# Patient Record
Sex: Female | Born: 1970 | Race: White | Hispanic: No | Marital: Married | Smoking: Never smoker
Health system: Southern US, Community
[De-identification: ages and names within clinical notes are randomized; demographics above are authoritative.]

---

## 2018-02-04 ENCOUNTER — Ambulatory Visit: Payer: Self-pay | Admitting: Orthopedic Surgery

## 2018-02-04 NOTE — H&P (View-Only) (Signed)
Lisa Frederick is an 47 y.o. female.   Chief Complaint: back and L leg pain, weakness, numbness HPI: G-Lower Back Reported by patient. Location: left Duration: 4 months (but has been worse for about 3 weeks) Context: bending (for several hours) Aggravating Factors: sitting; lying Associated Symptoms: weakness; numbness (left lower extremity); radiation down leg (left lower extremity) Prior Imaging: MRI Notes: The patient is taking Gabapentin and is on day 12 of Prednisone. The patient is referred by Dr. Charlann Boxer. Patient is fairly active. Has moved here from the [place] area. To a lot of lifting bending. She had 4 months of back and hip or buttock pain that has progressed more recently down into the left leg with numbness and weakness. She was seen here by Dr. Charlann Boxer was kindly referred her for an evaluation. She has been taking gabapentin and is currently on a steroid tapering dose pack. And she is unable to get comfortable. She reports severe pain.  Patient pain drawing is organic. Rates her pain as 9/10 on the visual analog scale.  No pertinent past medical history  No past surgical history  No family history on file. Social History:  has no tobacco, alcohol, and drug history on file.  Tobacco Smoking Status: Never smoker Non-smoker Most Recent Tobacco Use Screening: 01/23/2018 Chewing tobacco: none Alcohol intake: Occasional Hand Dominance: Right Work related injury?: N Advance directive: N Radiation protection practitioner of Attorney: N Live alone or with others?: with others Marital status: Married  Allergies: No Known Allergies  Medications: gabapentin 300 mg capsule Percocet 5 mg-325 mg tablet predniSONE  Review of Systems  Constitutional: Negative.   HENT: Negative.   Eyes: Negative.   Respiratory: Negative.   Cardiovascular: Negative.   Gastrointestinal: Negative.   Genitourinary: Negative.   Musculoskeletal: Positive for back pain.  Skin: Negative.   Neurological: Positive for  sensory change and focal weakness.    There were no vitals taken for this visit. Physical Exam  Constitutional: She is oriented to person, place, and time. She appears well-developed and well-nourished. She appears distressed.  HENT:  Head: Normocephalic.  Eyes: Pupils are equal, round, and reactive to light.  Neck: Normal range of motion.  Cardiovascular: Normal rate and normal heart sounds.  Respiratory: Effort normal.  GI: Soft.  Musculoskeletal:  Patient is a 47 year old female.  Gait and Station: Appearance: ambulating with no assistive devices and antalgic gait.  Constitutional: General Appearance: healthy-appearing and distress (mild).  Psychiatric: Mood and Affect: active and alert.  Cardiovascular System: Edema Right: none; Dorsalis and posterior tibial pulses 2+. Edema Left: none.  Abdomen: Inspection and Palpation: non-distended and no tenderness.  Skin: Inspection and palpation: no rash.  Lumbar Spine: Inspection: normal alignment. Bony Palpation of the Lumbar Spine: tender at lumbosacral junction.. Bony Palpation of the Right Hip: no tenderness of the greater trochanter and tenderness of the SI joint; Pelvis stable. Bony Palpation of the Left Hip: no tenderness of the greater trochanter and tenderness of the SI joint. Soft Tissue Palpation on the Right: No flank pain with percussion. Active Range of Motion: limited flexion and extention.  Motor Strength: L1 Motor Strength on the Right: hip flexion iliopsoas 5/5. L1 Motor Strength on the Left: hip flexion iliopsoas 5/5. L2-L4 Motor Strength on the Right: knee extension quadriceps 5/5. L2-L4 Motor Strength on the Left: knee extension quadriceps 5/5. L5 Motor Strength on the Right: ankle dorsiflexion tibialis anterior 5/5 and great toe extension extensor hallucis longus 5/5. L5 Motor Strength on the Left: ankle dorsiflexion tibialis  anterior 5/5 and great toe extension extensor hallucis longus 4/5. S1 Motor Strength on the  Right: plantar flexion gastrocnemius 5/5. S1 Motor Strength on the Left: plantar flexion gastrocnemius 4/5.  Neurological System: Knee Reflex Right: normal (2). Knee Reflex Left: normal (2). Ankle Reflex Right: normal (2). Ankle Reflex Left: diminished (1). Babinski Reflex Right: plantar reflex absent. Babinski Reflex Left: plantar reflex absent. Sensation on the Right: normal distal extremities. Sensation on the Left: normal distal extremities and dereased sensation on the sole of the foot and the posterior leg (S1). Special Tests on the Right: no clonus of the ankle/knee and seated straight leg raising test positive. Special Tests on the Left: no clonus of the ankle/knee and seated straight leg raising test positive.  Patient moderately severe distress. She is tearful. Walks with antalgic gait. Unable to straighten the left leg.  Neurological: She is alert and oriented to person, place, and time.    X-rays of lumbar spine including AP, lateral, flexion-extension demonstrates a sacralized lumbar vertebral body. There is disc space narrowing at the lowest open segment labeled L5-S1. There is no instability in flexion-extension. There is no pars defect. Hip joints are unremarkable.  MRI dated 01/29/2018 demonstrates transitional anatomy. There is a large disc herniation extruded compressing the S1 nerve root against the facet and lamina on the left.  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  She has marketed neural tension signs with EHL weakness as well as altered sensation the S1 dermatome and diminished at the Achilles reflex.  Had a long discussion concerning the pathology relevant anatomy and treatment options. Discussed the concepts of disc pressure management as well as conservative versus operative treatment.  Given the neural compression size of her disc herniation I do not feel that  an epidural steroid injection would give any predictive relief. She has engage when a home activity modification strategies to avoid reinjury from her move here to [place]. Given the duration of symptoms and of neural compression in the presence of a neurologic deficit I would therefore feel that it is reasonable to proceed at this point in time with a micro decompression of L5-S1 on the left. She would be unable to tolerate any form of supervised physical therapy at this point in time due to the lack of excursion of the S1 nerve root due to its compression.  At this point exhausting conservative treatment and in the presence of a neurologic deficit we discussed microlumbar decompression. I discussed the risks and benefits including bleeding, infection, DVT, PE, anesthetic complications, worsening in their symptoms, improvement in their symptoms, C SF leakage, epidural fibrosis, need for future surgeries such as revision discectomy and lumbar fusion. I also indicated that this is an operation to basically decompress the nerve root to allow recovery as opposed to fixing a herniated disc and that the incidence of recurrent chest disc herniation can approach 15%. Also that nerve root recovery is variable and may not recover completely.  I discussed the operative course including overnight in the hospital. Immediate ambulation. Follow-up in 2 weeks for suture removal. 6 weeks until healing of the herniation followed by 6 weeks of reconditioning and strengthening of the core musculature. Also discussed the need to employ the concepts of disc pressure management and core motion following the surgery to minimize the risk of recurrent disc herniation. We will obtain preoperative clearance i if necessary and proceed accordingly.  I  have her complete her steroid dose pack. We discussed titrating her gabapentin for effect.  Also gave her prescription of her Percocet to be taken as directed.  Proceed with  surgical scheduling. If there is any changes in her symptoms in the meantime she is to call.  In the interim we discussed avoiding stretching of the sciatic nerve. In addition we discussed the possibility of fusion in the future. She did not have any significant chronic back pain prior to this to suggest that a concomitant fusion would be recommended.  I favor the conservative approach at this point in time. He also discussed the nerve root recovery. The possibility that she may have permanent symptomatology.  Appreciate the kind referral by Dr. Charlann Boxerlin in his expertise in expediting her treatment.  Plan microlumbar decompression L5-S1 left  Lisa Frederick, Lisa Priebe M., PA-C for Dr. Shelle IronBeane 02/04/2018, 4:34 PM

## 2018-02-04 NOTE — H&P (Signed)
Lisa Frederick is an 47 y.o. female.   Chief Complaint: back and L leg pain, weakness, numbness HPI: G-Lower Back Reported by patient. Location: left Duration: 4 months (but has been worse for about 3 weeks) Context: bending (for several hours) Aggravating Factors: sitting; lying Associated Symptoms: weakness; numbness (left lower extremity); radiation down leg (left lower extremity) Prior Imaging: MRI Notes: The patient is taking Gabapentin and is on day 12 of Prednisone. The patient is referred by Dr. Olin. Patient is fairly active. Has moved here from the [place] area. To a lot of lifting bending. She had 4 months of back and hip or buttock pain that has progressed more recently down into the left leg with numbness and weakness. She was seen here by Dr. Olin was kindly referred her for an evaluation. She has been taking gabapentin and is currently on a steroid tapering dose pack. And she is unable to get comfortable. She reports severe pain.  Patient pain drawing is organic. Rates her pain as 9/10 on the visual analog scale.  No pertinent past medical history  No past surgical history  No family history on file. Social History:  has no tobacco, alcohol, and drug history on file.  Tobacco Smoking Status: Never smoker Non-smoker Most Recent Tobacco Use Screening: 01/23/2018 Chewing tobacco: none Alcohol intake: Occasional Hand Dominance: Right Work related injury?: N Advance directive: N Medical Power of Attorney: N Live alone or with others?: with others Marital status: Married  Allergies: No Known Allergies  Medications: gabapentin 300 mg capsule Percocet 5 mg-325 mg tablet predniSONE  Review of Systems  Constitutional: Negative.   HENT: Negative.   Eyes: Negative.   Respiratory: Negative.   Cardiovascular: Negative.   Gastrointestinal: Negative.   Genitourinary: Negative.   Musculoskeletal: Positive for back pain.  Skin: Negative.   Neurological: Positive for  sensory change and focal weakness.    There were no vitals taken for this visit. Physical Exam  Constitutional: She is oriented to person, place, and time. She appears well-developed and well-nourished. She appears distressed.  HENT:  Head: Normocephalic.  Eyes: Pupils are equal, round, and reactive to light.  Neck: Normal range of motion.  Cardiovascular: Normal rate and normal heart sounds.  Respiratory: Effort normal.  GI: Soft.  Musculoskeletal:  Patient is a 47-year-old female.  Gait and Station: Appearance: ambulating with no assistive devices and antalgic gait.  Constitutional: General Appearance: healthy-appearing and distress (mild).  Psychiatric: Mood and Affect: active and alert.  Cardiovascular System: Edema Right: none; Dorsalis and posterior tibial pulses 2+. Edema Left: none.  Abdomen: Inspection and Palpation: non-distended and no tenderness.  Skin: Inspection and palpation: no rash.  Lumbar Spine: Inspection: normal alignment. Bony Palpation of the Lumbar Spine: tender at lumbosacral junction.. Bony Palpation of the Right Hip: no tenderness of the greater trochanter and tenderness of the SI joint; Pelvis stable. Bony Palpation of the Left Hip: no tenderness of the greater trochanter and tenderness of the SI joint. Soft Tissue Palpation on the Right: No flank pain with percussion. Active Range of Motion: limited flexion and extention.  Motor Strength: L1 Motor Strength on the Right: hip flexion iliopsoas 5/5. L1 Motor Strength on the Left: hip flexion iliopsoas 5/5. L2-L4 Motor Strength on the Right: knee extension quadriceps 5/5. L2-L4 Motor Strength on the Left: knee extension quadriceps 5/5. L5 Motor Strength on the Right: ankle dorsiflexion tibialis anterior 5/5 and great toe extension extensor hallucis longus 5/5. L5 Motor Strength on the Left: ankle dorsiflexion tibialis   anterior 5/5 and great toe extension extensor hallucis longus 4/5. S1 Motor Strength on the  Right: plantar flexion gastrocnemius 5/5. S1 Motor Strength on the Left: plantar flexion gastrocnemius 4/5.  Neurological System: Knee Reflex Right: normal (2). Knee Reflex Left: normal (2). Ankle Reflex Right: normal (2). Ankle Reflex Left: diminished (1). Babinski Reflex Right: plantar reflex absent. Babinski Reflex Left: plantar reflex absent. Sensation on the Right: normal distal extremities. Sensation on the Left: normal distal extremities and dereased sensation on the sole of the foot and the posterior leg (S1). Special Tests on the Right: no clonus of the ankle/knee and seated straight leg raising test positive. Special Tests on the Left: no clonus of the ankle/knee and seated straight leg raising test positive.  Patient moderately severe distress. She is tearful. Walks with antalgic gait. Unable to straighten the left leg.  Neurological: She is alert and oriented to person, place, and time.    X-rays of lumbar spine including AP, lateral, flexion-extension demonstrates a sacralized lumbar vertebral body. There is disc space narrowing at the lowest open segment labeled L5-S1. There is no instability in flexion-extension. There is no pars defect. Hip joints are unremarkable.  MRI dated 01/29/2018 demonstrates transitional anatomy. There is a large disc herniation extruded compressing the S1 nerve root against the facet and lamina on the left.  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  She has marketed neural tension signs with EHL weakness as well as altered sensation the S1 dermatome and diminished at the Achilles reflex.  Had a long discussion concerning the pathology relevant anatomy and treatment options. Discussed the concepts of disc pressure management as well as conservative versus operative treatment.  Given the neural compression size of her disc herniation I do not feel that  an epidural steroid injection would give any predictive relief. She has engage when a home activity modification strategies to avoid reinjury from her move here to [place]. Given the duration of symptoms and of neural compression in the presence of a neurologic deficit I would therefore feel that it is reasonable to proceed at this point in time with a micro decompression of L5-S1 on the left. She would be unable to tolerate any form of supervised physical therapy at this point in time due to the lack of excursion of the S1 nerve root due to its compression.  At this point exhausting conservative treatment and in the presence of a neurologic deficit we discussed microlumbar decompression. I discussed the risks and benefits including bleeding, infection, DVT, PE, anesthetic complications, worsening in their symptoms, improvement in their symptoms, C SF leakage, epidural fibrosis, need for future surgeries such as revision discectomy and lumbar fusion. I also indicated that this is an operation to basically decompress the nerve root to allow recovery as opposed to fixing a herniated disc and that the incidence of recurrent chest disc herniation can approach 15%. Also that nerve root recovery is variable and may not recover completely.  I discussed the operative course including overnight in the hospital. Immediate ambulation. Follow-up in 2 weeks for suture removal. 6 weeks until healing of the herniation followed by 6 weeks of reconditioning and strengthening of the core musculature. Also discussed the need to employ the concepts of disc pressure management and core motion following the surgery to minimize the risk of recurrent disc herniation. We will obtain preoperative clearance i if necessary and proceed accordingly.  I   have her complete her steroid dose pack. We discussed titrating her gabapentin for effect.  Also gave her prescription of her Percocet to be taken as directed.  Proceed with  surgical scheduling. If there is any changes in her symptoms in the meantime she is to call.  In the interim we discussed avoiding stretching of the sciatic nerve. In addition we discussed the possibility of fusion in the future. She did not have any significant chronic back pain prior to this to suggest that a concomitant fusion would be recommended.  I favor the conservative approach at this point in time. He also discussed the nerve root recovery. The possibility that she may have permanent symptomatology.  Appreciate the kind referral by Dr. Olin in his expertise in expediting her treatment.  Plan microlumbar decompression L5-S1 left  Lisa Frederick M., PA-C for Dr. Beane 02/04/2018, 4:34 PM   

## 2018-02-05 NOTE — Patient Instructions (Signed)
Lisa Frederick  02/05/2018   Your procedure is scheduled on: 02-11-18   Report [facility] Main  Entr[facility]    Report to Admitting at 5:30  AM    Call this number if you have problems the morning of surgery [phone]   Remember: Do not eat food or drink liquids :After Midnight.    BRUSH YOUR TEETH MORNING OF SURGERY AND RINSE YOUR MOUTH OUT, NO CHEWING GUM CANDY OR MINTS.     Take these medicines the morning of surgery with A SIP OF WATER: Gabapentin and pain medication as needed                                You may not have any metal on your body including hair pins and              piercings  Do not wear jewelry, make-up, lotions, powders or perfumes, deodorant             Do not wear nail polish.  Do not shave  48 hours prior to surgery.               Do not bring valuables to the hospital. [place] [place] [facility] [place] IS NOT             RESPONSIBLE   FOR VALUABLES.  Contacts, dentures or bridgework may not be worn into surgery.  Leave suitcase in the car. After surgery it may be brought to your room.     Special Instructions: N/A              Please read over the following fact sheets you were given: _____________________________________________________________________             [facility] - Preparing for Surgery Before surgery, you can play an important role.  Because skin is not sterile, your skin needs to be as free of germs as possible.  You can reduce the number of germs on your skin by washing with CHG (chlorahexidine gluconate) soap before surgery.  CHG is an antiseptic cleaner which kills germs and bonds with the skin to continue killing germs even after washing. Please DO NOT use if you have an allergy to CHG or antibacterial soaps.  If your skin becomes reddened/irritated stop using the CHG and inform your nurse when you arrive at Short Stay. Do not shave (including legs and underarms) for at least 48 hours prior to the first CHG shower.  You may  shave your face/neck. Please follow these instructions carefully:  1.  Shower with CHG Soap the night before surgery and the  morning of Surgery.  2.  If you choose to wash your hair, wash your hair first as usual with your  normal  shampoo.  3.  After you shampoo, rinse your hair and body thoroughly to remove the  shampoo.                           4.  Use CHG as you would any other liquid soap.  You can apply chg directly  to the skin and wash                       Gently with a scrungie or clean washcloth.  5.  Apply the CHG Soap to  your body ONLY FROM THE NECK DOWN.   Do not use on face/ open                           Wound or open sores. Avoid contact with eyes, ears mouth and genitals (private parts).                       Wash face,  Genitals (private parts) with your normal soap.             6.  Wash thoroughly, paying special attention to the area where your surgery  will be performed.  7.  Thoroughly rinse your body with warm water from the neck down.  8.  DO NOT shower/wash with your normal soap after using and rinsing off  the CHG Soap.                9.  Pat yourself dry with a clean towel.            10.  Wear clean pajamas.            11.  Place clean sheets on your bed the night of your first shower and do not  sleep with pets. Day of Surgery : Do not apply any lotions/deodorants the morning of surgery.  Please wear clean clothes to the hospital/surgery center.  FAILURE TO FOLLOW THESE INSTRUCTIONS MAY RESULT IN THE CANCELLATION OF YOUR SURGERY PATIENT SIGNATURE_________________________________  NURSE SIGNATURE__________________________________  ________________________________________________________________________   Lisa Frederick  An incentive spirometer is a tool that can help keep your lungs clear and active. This tool measures how well you are filling your lungs with each breath. Taking long deep breaths may help reverse or decrease the chance of developing  breathing (pulmonary) problems (especially infection) following:  A long period of time when you are unable to move or be active. BEFORE THE PROCEDURE   If the spirometer includes an indicator to show your best effort, your nurse or respiratory therapist will set it to a desired goal.  If possible, sit up straight or lean slightly forward. Try not to slouch.  Hold the incentive spirometer in an upright position. INSTRUCTIONS FOR USE  1. Sit on the edge of your bed if possible, or sit up as far as you can in bed or on a chair. 2. Hold the incentive spirometer in an upright position. 3. Breathe out normally. 4. Place the mouthpiece in your mouth and seal your lips tightly around it. 5. Breathe in slowly and as deeply as possible, raising the piston or the ball toward the top of the column. 6. Hold your breath for 3-5 seconds or for as long as possible. Allow the piston or ball to fall to the bottom of the column. 7. Remove the mouthpiece from your mouth and breathe out normally. 8. Rest for a few seconds and repeat Steps 1 through 7 at least 10 times every 1-2 hours when you are awake. Take your time and take a few normal breaths between deep breaths. 9. The spirometer may include an indicator to show your best effort. Use the indicator as a goal to work toward during each repetition. 10. After each set of 10 deep breaths, practice coughing to be sure your lungs are clear. If you have an incision (the cut made at the time of surgery), support your incision when coughing by placing a pillow or rolled up towels firmly against  it. Once you are able to get out of bed, walk around indoors and cough well. You may stop using the incentive spirometer when instructed by your caregiver.  RISKS AND COMPLICATIONS  Take your time so you do not get dizzy or light-headed.  If you are in pain, you may need to take or ask for pain medication before doing incentive spirometry. It is harder to take a deep  breath if you are having pain. AFTER USE  Rest and breathe slowly and easily.  It can be helpful to keep track of a log of your progress. Your caregiver can provide you with a simple table to help with this. If you are using the spirometer at home, follow these instructions: [place] IF:   You are having difficultly using the spirometer.  You have trouble using the spirometer as often as instructed.  Your pain medication is not giving enough relief while using the spirometer.  You develop fever of 100.5 F (38.1 C) or higher. SEEK IMMEDIATE MEDICAL CARE IF:   You cough up bloody sputum that had not been present before.  You develop fever of 102 F (38.9 C) or greater.  You develop worsening pain at or near the incision site. MAKE SURE YOU:   Understand these instructions.  Will watch your condition.  Will get help right away if you are not doing well or get worse. Document Released: 09/16/2006 Document Revised: 07/29/2011 Document Reviewed: 11/17/2006 [facility] Patient Information 2014 [place], [place].   ________________________________________________________________________

## 2018-02-10 ENCOUNTER — Encounter (HOSPITAL_COMMUNITY)
Admission: RE | Admit: 2018-02-10 | Discharge: 2018-02-10 | Disposition: A | Discharge status: Home / Self Care | Payer: 59 | Source: Ambulatory Visit | Attending: Specialist | Admitting: Specialist

## 2018-02-10 ENCOUNTER — Encounter (HOSPITAL_COMMUNITY): Payer: Self-pay | Admitting: *Deleted

## 2018-02-10 ENCOUNTER — Ambulatory Visit (HOSPITAL_COMMUNITY)
Admission: RE | Admit: 2018-02-10 | Discharge: 2018-02-10 | Disposition: A | Discharge status: Home / Self Care | Payer: 59 | Source: Ambulatory Visit | Attending: Orthopedic Surgery | Admitting: Orthopedic Surgery

## 2018-02-10 ENCOUNTER — Other Ambulatory Visit: Payer: Self-pay

## 2018-02-10 DIAGNOSIS — M5126 Other intervertebral disc displacement, lumbar region: Secondary | ICD-10-CM | POA: Diagnosis present

## 2018-02-10 LAB — BASIC METABOLIC PANEL
ANION GAP: 7 (ref 5–15)
BUN: 21 mg/dL — ABNORMAL HIGH (ref 6–20)
CO2: 29 mmol/L (ref 22–32)
Calcium: 9.5 mg/dL (ref 8.9–10.3)
Chloride: 106 mmol/L (ref 98–111)
Creatinine, Ser: 0.76 mg/dL (ref 0.44–1.00)
GFR calc Af Amer: >60 mL/min (ref >60)
GLUCOSE: 83 mg/dL (ref 70–99)
POTASSIUM: 4.1 mmol/L (ref 3.5–5.1)
Sodium: 142 mmol/L (ref 135–145)

## 2018-02-10 LAB — CBC
HEMATOCRIT: 37.3 % (ref 36.0–46.0)
HEMOGLOBIN: 12.7 g/dL (ref 12.0–15.0)
MCH: 35.1 pg — AB (ref 26.0–34.0)
MCHC: 34 g/dL (ref 30.0–36.0)
MCV: 103 fL — ABNORMAL HIGH (ref 78.0–100.0)
Platelets: 245 10*3/uL (ref 150–400)
RBC: 3.62 MIL/uL — ABNORMAL LOW (ref 3.87–5.11)
RDW: 12.4 % (ref 11.5–15.5)
WBC: 6.3 10*3/uL (ref 4.0–10.5)

## 2018-02-10 LAB — SURGICAL PCR SCREEN
MRSA, PCR: NEGATIVE
Staphylococcus aureus: NEGATIVE

## 2018-02-10 LAB — PREGNANCY, URINE: Preg Test, Ur: NEGATIVE

## 2018-02-10 NOTE — Anesthesia Preprocedure Evaluation (Addendum)
Anesthesia Evaluation  Patient identified by MRN, date of birth, ID band Patient awake    Reviewed: Allergy & Precautions, NPO status , Patient's Chart, lab work & pertinent test results  Airway Mallampati: II  TM Distance: >3 FB Neck ROM: Full    Dental  (+) Dental Advisory Given   Pulmonary neg pulmonary ROS,    breath sounds clear to auscultation       Cardiovascular negative cardio ROS   Rhythm:Regular Rate:Normal     Neuro/Psych negative neurological ROS     GI/Hepatic negative GI ROS, Neg liver ROS,   Endo/Other  negative endocrine ROS  Renal/GU negative Renal ROS     Musculoskeletal   Abdominal   Peds  Hematology negative hematology ROS (+)   Anesthesia Other Findings   Reproductive/Obstetrics                            Lab Results  Component Value Date   WBC 6.3 02/10/2018   HGB 12.7 02/10/2018   HCT 37.3 02/10/2018   MCV 103.0 (H) 02/10/2018   PLT 245 02/10/2018   Lab Results  Component Value Date   CREATININE 0.76 02/10/2018   BUN 21 (H) 02/10/2018   NA 142 02/10/2018   K 4.1 02/10/2018   CL 106 02/10/2018   CO2 29 02/10/2018    Anesthesia Physical Anesthesia Plan  ASA: I  Anesthesia Plan: General   Post-op Pain Management:    Induction: Intravenous  PONV Risk Score and Plan: 3 and Midazolam, Dexamethasone, Ondansetron and Treatment may vary due to age or medical condition  Airway Management Planned: Oral ETT  Additional Equipment:   Intra-op Plan:   Post-operative Plan: Extubation in OR  Informed Consent: I have reviewed the patients History and Physical, chart, labs and discussed the procedure including the risks, benefits and alternatives for the proposed anesthesia with the patient or authorized representative who has indicated his/her understanding and acceptance.   Dental advisory given  Plan Discussed with: CRNA  Anesthesia Plan Comments:         Anesthesia Quick Evaluation

## 2018-02-11 ENCOUNTER — Ambulatory Visit (HOSPITAL_COMMUNITY): Payer: 59

## 2018-02-11 ENCOUNTER — Encounter (HOSPITAL_COMMUNITY)
Admission: RE | Disposition: A | Discharge status: Home / Self Care | Payer: Self-pay | Source: Ambulatory Visit | Attending: Specialist

## 2018-02-11 ENCOUNTER — Encounter (HOSPITAL_COMMUNITY): Payer: Self-pay

## 2018-02-11 ENCOUNTER — Ambulatory Visit (HOSPITAL_COMMUNITY)
Admission: RE | Admit: 2018-02-11 | Discharge: 2018-02-12 | Disposition: A | Discharge status: Home / Self Care | Payer: 59 | Source: Ambulatory Visit | Attending: Specialist | Admitting: Specialist

## 2018-02-11 ENCOUNTER — Ambulatory Visit (HOSPITAL_COMMUNITY): Payer: 59 | Admitting: Certified Registered"

## 2018-02-11 DIAGNOSIS — R531 Weakness: Secondary | ICD-10-CM | POA: Insufficient documentation

## 2018-02-11 DIAGNOSIS — M5117 Intervertebral disc disorders with radiculopathy, lumbosacral region: Secondary | ICD-10-CM | POA: Diagnosis not present

## 2018-02-11 DIAGNOSIS — M5126 Other intervertebral disc displacement, lumbar region: Secondary | ICD-10-CM

## 2018-02-11 DIAGNOSIS — R200 Anesthesia of skin: Secondary | ICD-10-CM | POA: Diagnosis not present

## 2018-02-11 DIAGNOSIS — Z79899 Other long term (current) drug therapy: Secondary | ICD-10-CM | POA: Insufficient documentation

## 2018-02-11 DIAGNOSIS — M4807 Spinal stenosis, lumbosacral region: Secondary | ICD-10-CM | POA: Diagnosis present

## 2018-02-11 DIAGNOSIS — Z419 Encounter for procedure for purposes other than remedying health state, unspecified: Secondary | ICD-10-CM

## 2018-02-11 HISTORY — PX: LUMBAR LAMINECTOMY/DECOMPRESSION MICRODISCECTOMY: SHX5026

## 2018-02-11 SURGERY — LUMBAR LAMINECTOMY/DECOMPRESSION MICRODISCECTOMY 1 LEVEL
Anesthesia: General | Laterality: Left

## 2018-02-11 MED ORDER — SUGAMMADEX SODIUM 200 MG/2ML IV SOLN
INTRAVENOUS | Status: DC | PRN
  Administered 2018-02-11: 100 mg via INTRAVENOUS

## 2018-02-11 MED ORDER — BUPIVACAINE-EPINEPHRINE 0.5% -1:200000 IJ SOLN
INTRAMUSCULAR | Status: DC | PRN
  Administered 2018-02-11: 10 mL

## 2018-02-11 MED ORDER — SODIUM CHLORIDE 0.9 % IV SOLN
INTRAVENOUS | Status: DC | PRN
  Administered 2018-02-11: 500 mL

## 2018-02-11 MED ORDER — SODIUM CHLORIDE 0.9 % IV SOLN
INTRAVENOUS | Status: AC
  Filled 2018-02-11: qty 500000

## 2018-02-11 MED ORDER — THROMBIN (RECOMBINANT) 5000 UNITS EX SOLR
CUTANEOUS | Status: AC
  Filled 2018-02-11: qty 10000

## 2018-02-11 MED ORDER — MIDAZOLAM HCL 5 MG/5ML IJ SOLN
INTRAMUSCULAR | Status: DC | PRN
  Administered 2018-02-11: 2 mg via INTRAVENOUS

## 2018-02-11 MED ORDER — OXYCODONE HCL 5 MG PO TABS
10.0000 mg | ORAL_TABLET | ORAL | Status: DC | PRN
  Administered 2018-02-11 – 2018-02-12 (×3): 10 mg via ORAL
  Filled 2018-02-11 (×3): qty 2

## 2018-02-11 MED ORDER — ROCURONIUM BROMIDE 100 MG/10ML IV SOLN
INTRAVENOUS | Status: DC | PRN
  Administered 2018-02-11 (×2): 10 mg via INTRAVENOUS
  Administered 2018-02-11 (×2): 5 mg via INTRAVENOUS
  Administered 2018-02-11: 30 mg via INTRAVENOUS
  Administered 2018-02-11: 20 mg via INTRAVENOUS

## 2018-02-11 MED ORDER — POLYETHYLENE GLYCOL 3350 17 G PO PACK: 17.0000 g | PACK | Freq: Every day | ORAL | Status: DC | PRN

## 2018-02-11 MED ORDER — SCOPOLAMINE 1 MG/3DAYS TD PT72
MEDICATED_PATCH | TRANSDERMAL | Status: AC
  Filled 2018-02-11: qty 1

## 2018-02-11 MED ORDER — DEXAMETHASONE SODIUM PHOSPHATE 10 MG/ML IJ SOLN
INTRAMUSCULAR | Status: DC | PRN
  Administered 2018-02-11: 10 mg via INTRAVENOUS

## 2018-02-11 MED ORDER — MAGNESIUM CITRATE PO SOLN: 1.0000 | Freq: Once | ORAL | Status: DC | PRN

## 2018-02-11 MED ORDER — DOCUSATE SODIUM 100 MG PO CAPS: 100.0000 mg | ORAL_CAPSULE | Freq: Two times a day (BID) | ORAL | 2 refills | Status: DC

## 2018-02-11 MED ORDER — ACETAMINOPHEN 325 MG PO TABS: 650.0000 mg | ORAL_TABLET | ORAL | Status: DC | PRN

## 2018-02-11 MED ORDER — CEFAZOLIN SODIUM-DEXTROSE 2-4 GM/100ML-% IV SOLN
2.0000 g | INTRAVENOUS | Status: AC
  Administered 2018-02-11: 2 g via INTRAVENOUS
  Filled 2018-02-11: qty 100

## 2018-02-11 MED ORDER — METHOCARBAMOL 500 MG IVPB - SIMPLE MED
INTRAVENOUS | Status: AC
  Filled 2018-02-11: qty 50

## 2018-02-11 MED ORDER — MENTHOL 3 MG MT LOZG: 1.0000 | LOZENGE | OROMUCOSAL | Status: DC | PRN

## 2018-02-11 MED ORDER — ONDANSETRON HCL 4 MG/2ML IJ SOLN
INTRAMUSCULAR | Status: AC
  Filled 2018-02-11: qty 2

## 2018-02-11 MED ORDER — LIDOCAINE 2% (20 MG/ML) 5 ML SYRINGE
INTRAMUSCULAR | Status: DC | PRN
  Administered 2018-02-11: 60 mg via INTRAVENOUS

## 2018-02-11 MED ORDER — SCOPOLAMINE 1 MG/3DAYS TD PT72
MEDICATED_PATCH | TRANSDERMAL | Status: DC | PRN
  Administered 2018-02-11: 1 via TRANSDERMAL

## 2018-02-11 MED ORDER — PROPOFOL 10 MG/ML IV BOLUS
INTRAVENOUS | Status: AC
  Filled 2018-02-11: qty 20

## 2018-02-11 MED ORDER — ONDANSETRON HCL 4 MG/2ML IJ SOLN
INTRAMUSCULAR | Status: DC | PRN
  Administered 2018-02-11 (×2): 4 mg via INTRAVENOUS

## 2018-02-11 MED ORDER — ACETAMINOPHEN 10 MG/ML IV SOLN
1000.0000 mg | INTRAVENOUS | Status: AC
  Administered 2018-02-11: 1000 mg via INTRAVENOUS
  Filled 2018-02-11: qty 100

## 2018-02-11 MED ORDER — OXYCODONE HCL 5 MG PO TABS
5.0000 mg | ORAL_TABLET | ORAL | Status: DC | PRN
  Administered 2018-02-11: 5 mg via ORAL
  Filled 2018-02-11: qty 1

## 2018-02-11 MED ORDER — POLYETHYLENE GLYCOL 3350 17 G PO PACK: 17.0000 g | PACK | Freq: Every day | ORAL | 0 refills | Status: DC

## 2018-02-11 MED ORDER — LIDOCAINE 2% (20 MG/ML) 5 ML SYRINGE
INTRAMUSCULAR | Status: AC
  Filled 2018-02-11: qty 5

## 2018-02-11 MED ORDER — ACETAMINOPHEN 650 MG RE SUPP: 650.0000 mg | RECTAL | Status: DC | PRN

## 2018-02-11 MED ORDER — SODIUM CHLORIDE 0.9 % IJ SOLN
INTRAMUSCULAR | Status: AC
  Filled 2018-02-11: qty 10

## 2018-02-11 MED ORDER — THROMBIN 5000 UNITS EX SOLR
OROMUCOSAL | Status: DC | PRN
  Administered 2018-02-11: 10:00:00 via TOPICAL

## 2018-02-11 MED ORDER — HYDROMORPHONE HCL 1 MG/ML IJ SOLN: 0.5000 mg | INTRAMUSCULAR | Status: DC | PRN

## 2018-02-11 MED ORDER — FENTANYL CITRATE (PF) 100 MCG/2ML IJ SOLN
25.0000 ug | INTRAMUSCULAR | Status: DC | PRN
  Administered 2018-02-11 (×2): 25 ug via INTRAVENOUS

## 2018-02-11 MED ORDER — ONDANSETRON HCL 4 MG PO TABS: 4.0000 mg | ORAL_TABLET | Freq: Four times a day (QID) | ORAL | Status: DC | PRN

## 2018-02-11 MED ORDER — SUGAMMADEX SODIUM 200 MG/2ML IV SOLN
INTRAVENOUS | Status: AC
  Filled 2018-02-11: qty 2

## 2018-02-11 MED ORDER — BUPIVACAINE-EPINEPHRINE (PF) 0.5% -1:200000 IJ SOLN
INTRAMUSCULAR | Status: AC
  Filled 2018-02-11: qty 30

## 2018-02-11 MED ORDER — RISAQUAD PO CAPS
1.0000 | ORAL_CAPSULE | Freq: Every day | ORAL | Status: DC
  Administered 2018-02-11 – 2018-02-12 (×2): 1 via ORAL
  Filled 2018-02-11 (×2): qty 1

## 2018-02-11 MED ORDER — DOCUSATE SODIUM 100 MG PO CAPS
100.0000 mg | ORAL_CAPSULE | Freq: Two times a day (BID) | ORAL | Status: DC
  Administered 2018-02-11 – 2018-02-12 (×2): 100 mg via ORAL
  Filled 2018-02-11 (×2): qty 1

## 2018-02-11 MED ORDER — KCL IN DEXTROSE-NACL 20-5-0.45 MEQ/L-%-% IV SOLN
INTRAVENOUS | Status: DC
  Administered 2018-02-11: 16:00:00 via INTRAVENOUS
  Filled 2018-02-11: qty 1000

## 2018-02-11 MED ORDER — ALUM & MAG HYDROXIDE-SIMETH 200-200-20 MG/5ML PO SUSP: 30.0000 mL | Freq: Four times a day (QID) | ORAL | Status: DC | PRN

## 2018-02-11 MED ORDER — FENTANYL CITRATE (PF) 100 MCG/2ML IJ SOLN
INTRAMUSCULAR | Status: AC
  Filled 2018-02-11: qty 2

## 2018-02-11 MED ORDER — LACTATED RINGERS IV SOLN
INTRAVENOUS | Status: DC
  Administered 2018-02-11: 09:00:00 via INTRAVENOUS
  Administered 2018-02-11: 1000 mL via INTRAVENOUS

## 2018-02-11 MED ORDER — PROMETHAZINE HCL 25 MG/ML IJ SOLN: 6.2500 mg | INTRAMUSCULAR | Status: DC | PRN

## 2018-02-11 MED ORDER — CEFAZOLIN SODIUM-DEXTROSE 2-4 GM/100ML-% IV SOLN
2.0000 g | Freq: Three times a day (TID) | INTRAVENOUS | Status: AC
  Administered 2018-02-11 (×2): 2 g via INTRAVENOUS
  Filled 2018-02-11 (×2): qty 100

## 2018-02-11 MED ORDER — METHOCARBAMOL 500 MG PO TABS
500.0000 mg | ORAL_TABLET | Freq: Four times a day (QID) | ORAL | Status: DC | PRN
  Administered 2018-02-11 – 2018-02-12 (×2): 500 mg via ORAL
  Filled 2018-02-11 (×2): qty 1

## 2018-02-11 MED ORDER — FENTANYL CITRATE (PF) 100 MCG/2ML IJ SOLN
INTRAMUSCULAR | Status: DC | PRN
  Administered 2018-02-11: 50 ug via INTRAVENOUS
  Administered 2018-02-11: 25 ug via INTRAVENOUS
  Administered 2018-02-11 (×2): 50 ug via INTRAVENOUS
  Administered 2018-02-11: 25 ug via INTRAVENOUS

## 2018-02-11 MED ORDER — DEXAMETHASONE SODIUM PHOSPHATE 10 MG/ML IJ SOLN
INTRAMUSCULAR | Status: AC
  Filled 2018-02-11: qty 1

## 2018-02-11 MED ORDER — GABAPENTIN 300 MG PO CAPS: 300.0000 mg | ORAL_CAPSULE | Freq: Two times a day (BID) | ORAL | Status: DC | PRN

## 2018-02-11 MED ORDER — PREDNISONE 5 MG PO TABS: 5.0000 mg | ORAL_TABLET | ORAL | Status: DC

## 2018-02-11 MED ORDER — PROPOFOL 10 MG/ML IV BOLUS
INTRAVENOUS | Status: DC | PRN
  Administered 2018-02-11: 150 mg via INTRAVENOUS
  Administered 2018-02-11: 30 mg via INTRAVENOUS

## 2018-02-11 MED ORDER — ROCURONIUM BROMIDE 10 MG/ML (PF) SYRINGE
PREFILLED_SYRINGE | INTRAVENOUS | Status: AC
  Filled 2018-02-11: qty 10

## 2018-02-11 MED ORDER — MIDAZOLAM HCL 2 MG/2ML IJ SOLN
INTRAMUSCULAR | Status: AC
  Filled 2018-02-11: qty 2

## 2018-02-11 MED ORDER — METHOCARBAMOL 500 MG PO TABS: 500.0000 mg | ORAL_TABLET | Freq: Four times a day (QID) | ORAL | 1 refills | Status: DC | PRN

## 2018-02-11 MED ORDER — OXYCODONE HCL 5 MG PO TABS: 5.0000 mg | ORAL_TABLET | ORAL | 0 refills | Status: DC | PRN

## 2018-02-11 MED ORDER — FENTANYL CITRATE (PF) 250 MCG/5ML IJ SOLN
INTRAMUSCULAR | Status: AC
  Filled 2018-02-11: qty 5

## 2018-02-11 MED ORDER — BISACODYL 5 MG PO TBEC: 5.0000 mg | DELAYED_RELEASE_TABLET | Freq: Every day | ORAL | Status: DC | PRN

## 2018-02-11 MED ORDER — METHOCARBAMOL 500 MG IVPB - SIMPLE MED
500.0000 mg | Freq: Four times a day (QID) | INTRAVENOUS | Status: DC | PRN
  Administered 2018-02-11: 500 mg via INTRAVENOUS
  Filled 2018-02-11: qty 50

## 2018-02-11 MED ORDER — PROPOFOL 500 MG/50ML IV EMUL
INTRAVENOUS | Status: DC | PRN
  Administered 2018-02-11: 25 ug/kg/min via INTRAVENOUS

## 2018-02-11 MED ORDER — ONDANSETRON HCL 4 MG/2ML IJ SOLN: 4.0000 mg | Freq: Four times a day (QID) | INTRAMUSCULAR | Status: DC | PRN

## 2018-02-11 MED ORDER — PHENOL 1.4 % MT LIQD: 1.0000 | OROMUCOSAL | Status: DC | PRN

## 2018-02-11 SURGICAL SUPPLY — 51 items
BAG ZIPLOCK 12X15 (MISCELLANEOUS) IMPLANT
CLEANER TIP ELECTROSURG 2X2 (MISCELLANEOUS) ×2 IMPLANT
CLOTH 2% CHLOROHEXIDINE 3PK (PERSONAL CARE ITEMS) ×2 IMPLANT
COVER SURGICAL LIGHT HANDLE (MISCELLANEOUS) ×2 IMPLANT
DRAPE MICROSCOPE LEICA (MISCELLANEOUS) ×2 IMPLANT
DRAPE POUCH INSTRU U-SHP 10X18 (DRAPES) ×2 IMPLANT
DRAPE SHEET LG 3/4 BI-LAMINATE (DRAPES) ×2 IMPLANT
DRAPE SURG 17X11 SM STRL (DRAPES) ×2 IMPLANT
DRAPE UTILITY XL STRL (DRAPES) ×2 IMPLANT
DRSG AQUACEL AG ADV 3.5X 4 (GAUZE/BANDAGES/DRESSINGS) ×2 IMPLANT
DRSG AQUACEL AG ADV 3.5X 6 (GAUZE/BANDAGES/DRESSINGS) IMPLANT
DRSG TELFA 3X8 NADH (GAUZE/BANDAGES/DRESSINGS) IMPLANT
DURAPREP 26ML APPLICATOR (WOUND CARE) ×2 IMPLANT
DURASEAL SPINE SEALANT 3ML (MISCELLANEOUS) IMPLANT
ELECT BLADE TIP CTD 4 INCH (ELECTRODE) ×2 IMPLANT
ELECT REM PT RETURN 15FT ADLT (MISCELLANEOUS) ×2 IMPLANT
GLOVE BIOGEL PI IND STRL 7.5 (GLOVE) ×1 IMPLANT
GLOVE BIOGEL PI IND STRL 9 (GLOVE) ×1 IMPLANT
GLOVE BIOGEL PI INDICATOR 7.5 (GLOVE) ×1
GLOVE BIOGEL PI INDICATOR 9 (GLOVE) ×1
GLOVE ECLIPSE 8.5 STRL (GLOVE) ×2 IMPLANT
GLOVE SURG SS PI 7.5 STRL IVOR (GLOVE) ×4 IMPLANT
GLOVE SURG SS PI 8.0 STRL IVOR (GLOVE) ×6 IMPLANT
GOWN SPEC L4 XLG W/TWL (GOWN DISPOSABLE) ×2 IMPLANT
GOWN STRL REUS W/TWL XL LVL3 (GOWN DISPOSABLE) ×4 IMPLANT
HEMOSTAT SPONGE AVITENE ULTRA (HEMOSTASIS) IMPLANT
IV CATH 14GX2 1/4 (CATHETERS) ×2 IMPLANT
KIT BASIN OR (CUSTOM PROCEDURE TRAY) ×2 IMPLANT
KIT POSITIONING SURG ANDREWS (MISCELLANEOUS) ×2 IMPLANT
MANIFOLD NEPTUNE II (INSTRUMENTS) ×2 IMPLANT
NEEDLE SPNL 18GX3.5 QUINCKE PK (NEEDLE) ×4 IMPLANT
PACK LAMINECTOMY ORTHO (CUSTOM PROCEDURE TRAY) ×2 IMPLANT
PATTIES SURGICAL .5 X.5 (GAUZE/BANDAGES/DRESSINGS) ×4 IMPLANT
PATTIES SURGICAL .75X.75 (GAUZE/BANDAGES/DRESSINGS) ×2 IMPLANT
PATTIES SURGICAL 1X1 (DISPOSABLE) IMPLANT
RUBBERBAND STERILE (MISCELLANEOUS) ×2 IMPLANT
SPONGE SURGIFOAM ABS GEL 100 (HEMOSTASIS) ×2 IMPLANT
STAPLER VISISTAT (STAPLE) IMPLANT
STRIP CLOSURE SKIN 1/2X4 (GAUZE/BANDAGES/DRESSINGS) ×2 IMPLANT
SUT NURALON 4 0 TR CR/8 (SUTURE) IMPLANT
SUT PROLENE 3 0 PS 2 (SUTURE) ×2 IMPLANT
SUT VIC AB 1 CT1 27 (SUTURE)
SUT VIC AB 1 CT1 27XBRD ANTBC (SUTURE) IMPLANT
SUT VIC AB 1-0 CT2 27 (SUTURE) ×2 IMPLANT
SUT VIC AB 2-0 CT1 27 (SUTURE)
SUT VIC AB 2-0 CT1 TAPERPNT 27 (SUTURE) IMPLANT
SUT VIC AB 2-0 CT2 27 (SUTURE) ×2 IMPLANT
SYR 3ML LL SCALE MARK (SYRINGE) IMPLANT
TOWEL OR 17X26 10 PK STRL BLUE (TOWEL DISPOSABLE) IMPLANT
TOWEL OR NON WOVEN STRL DISP B (DISPOSABLE) IMPLANT
YANKAUER SUCT BULB TIP NO VENT (SUCTIONS) ×2 IMPLANT

## 2018-02-11 NOTE — Anesthesia Procedure Notes (Signed)
Procedure Name: Intubation Date/Time: 02/11/2018 7:29 AM Performed by: Marny Lowenstein, CRNA Pre-anesthesia Checklist: Patient identified, Emergency Drugs available, Suction available and Patient being monitored Patient Re-evaluated:Patient Re-evaluated prior to induction Oxygen Delivery Method: Circle system utilized Preoxygenation: Pre-oxygenation with 100% oxygen Induction Type: IV induction Ventilation: Mask ventilation without difficulty Laryngoscope Size: Miller and 2 Grade View: Grade I Tube type: Oral Tube size: 6.5 mm Number of attempts: 1 Placement Confirmation: ETT inserted through vocal cords under direct vision,  positive ETCO2,  CO2 detector and breath sounds checked- equal and bilateral Secured at: 19 cm Tube secured with: Tape Dental Injury: Teeth and Oropharynx as per pre-operative assessment

## 2018-02-11 NOTE — Interval H&P Note (Signed)
History and Physical Interval Note:  02/11/2018 7:26 AM  Lisa Frederick  has presented today for surgery, with the diagnosis of HNP L5-S1  The various methods of treatment have been discussed with the patient and family. After consideration of risks, benefits and other options for treatment, the patient has consented to  Procedure(s) with comments: Microlumbar decompression L5-S1 left (Left) - 2 hrs as a surgical intervention .  The patient's history has been reviewed, patient examined, no change in status, stable for surgery.  I have reviewed the patient's chart and labs.  Questions were answered to the patient's satisfaction.     Shaolin Armas C   

## 2018-02-11 NOTE — Brief Op Note (Signed)
02/11/2018  9:42 AM  PATIENT:  Lisa Frederick  47 y.o. female  PRE-OPERATIVE DIAGNOSIS:  HNP L5-S1  POST-OPERATIVE DIAGNOSIS:  HNP L5-S1  PROCEDURE:  Procedure(s) with comments: Microlumbar decompression L5-S1 left (Left) - 2 hrs  SURGEON:  Surgeon(s) and Role:    Jene Every, MD - Primary  PHYSICIAN ASSISTANT:   ASSISTANTS: Bissell   ANESTHESIA:   general  EBL:  50 mL   BLOOD ADMINISTERED:none  DRAINS: none   LOCAL MEDICATIONS USED:  MARCAINE     SPECIMEN:  Source of Specimen:  L5S1  DISPOSITION OF SPECIMEN:  PATHOLOGY  COUNTS:  YES  TOURNIQUET:  * No tourniquets in log *  DICTATION: .Other Dictation: Dictation Number [id number]  PLAN OF CARE: Admit for overnight observation  PATIENT DISPOSITION:  PACU - hemodynamically stable.   Delay start of Pharmacological VTE agent (>24hrs) due to surgical blood loss or risk of bleeding: yes

## 2018-02-11 NOTE — Plan of Care (Signed)
Postoperative course discussed with patient and husband

## 2018-02-11 NOTE — Anesthesia Postprocedure Evaluation (Signed)
Anesthesia Post Note  Patient: Isabella Roemmich  Procedure(s) Performed: Microlumbar decompression L5-S1 left (Left )     Patient location during evaluation: PACU Anesthesia Type: General Level of consciousness: awake and alert Pain management: pain level controlled Vital Signs Assessment: post-procedure vital signs reviewed and stable Respiratory status: spontaneous breathing, nonlabored ventilation, respiratory function stable and patient connected to nasal cannula oxygen Cardiovascular status: blood pressure returned to baseline and stable Postop Assessment: no apparent nausea or vomiting Anesthetic complications: no    Last Vitals:  Vitals:   02/11/18 1300 02/11/18 1315  BP: 96/61   Pulse: (!) 56 62  Resp:    Temp:    SpO2: 100% 100%    Last Pain:  Vitals:   02/11/18 1230  TempSrc:   PainSc: 2                  Kennieth Rad

## 2018-02-11 NOTE — Evaluation (Signed)
Physical Therapy Evaluation Patient Details Name: Lisa Frederick MRN: [id number] DOB: Mar 17, 1971 Today's Date: 02/11/2018   History of Present Illness  Pt is a 47 YO female s/p microlumbar decompression, foraminotomies, and microdiskectomy of L5-S1 level. PMH and surgical history unremarkable.   Clinical Impression  Pt presents with decreased knowledge of back precautions, some difficulty performing mobility tasks, and back pain. Acute PT to address deficits. Pt tolerated mobility well today, maintaining back precautions throughout the session. Pt with some anxiety and tearful moments during session, but pt highly motivated to progress mobility. PT to progress mobility as tolerated and will continue to follow acutely. Pt plans to d/c home tomorrow.     Follow Up Recommendations Follow surgeon's recommendation for DC plan and follow-up therapies;Supervision for mobility/OOB    Equipment Recommendations  None recommended by PT    Recommendations for Other Services       Precautions / Restrictions Precautions Precautions: Fall;Back Precaution Booklet Issued: Yes (comment) Precaution Comments: handout issued, reviewed, demonstrated, and practiced  Restrictions Weight Bearing Restrictions: No      Mobility  Bed Mobility Overal bed mobility: Needs Assistance Bed Mobility: Rolling;Sidelying to Sit;Sit to Sidelying Rolling: Supervision Sidelying to sit: Min guard     Sit to sidelying: Min guard General bed mobility comments: After demonstration of log roll technique, pt supervision for rolling in bed for safety. Min guard during sidelying<>sit in case pt needed LE assist into and out of bed. Pt tearful, stating she was anxious, after moving from EOB to lying. Pt wanted to continue session.    Transfers Overall transfer level: Needs assistance Equipment used: None Transfers: Sit to/from Stand Sit to Stand: Supervision         General transfer comment: Supervision for safety. Pt  with increased time and effort to perform, focusing on bending at hips and knees as opposed to back.   Ambulation/Gait Ambulation/Gait assistance: (Pt ambulated with RN prior to PT visit, ambulation deferred at this time)              Stairs            Wheelchair Mobility    Modified Rankin (Stroke Patients Only)       Balance Overall balance assessment: Modified Independent                                           Pertinent Vitals/Pain Pain Assessment: 0-10 Pain Score: 5  Pain Location: back  Pain Descriptors / Indicators: Sore;Pins and needles;Aching Pain Intervention(s): Monitored during session;Repositioned;Limited activity within patient's tolerance;Ice applied    Home Living Family/patient expects to be discharged to:: Private residence Living Arrangements: Spouse/significant other Available Help at Discharge: Family;Available PRN/intermittently Type of Home: House Home Access: Stairs to enter Entrance Stairs-Rails: None Entrance Stairs-Number of Steps: 2 in the front, 5 in the garage area  Home Layout: Two level;Able to live on main level with bedroom/bathroom Home Equipment: None      Prior Function Level of Independence: Independent               Hand Dominance   Dominant Hand: Right    Extremity/Trunk Assessment   Upper Extremity Assessment Upper Extremity Assessment: Overall WFL for tasks assessed    Lower Extremity Assessment Lower Extremity Assessment: Overall WFL for tasks assessed;LLE deficits/detail LLE Deficits / Details: some numbness/radiation down LLE that pt had pre-surgery. Pt reports LLE  less painful post-op.     Cervical / Trunk Assessment Cervical / Trunk Assessment: Normal  Communication   Communication: No difficulties  Cognition Arousal/Alertness: Awake/alert Behavior During Therapy: WFL for tasks assessed/performed;Anxious Overall Cognitive Status: Within Functional Limits for tasks  assessed                                        General Comments General comments (skin integrity, edema, etc.): PT demonstrated all back precautions (bending/lifting/twisting/arching). Handout reviewed in full with pt, pt demonstrating proficiency in log rolling technique, moving from sidelying to sit, sit to stand, stand to sit, and sit to sidelying.     Exercises     Assessment/Plan    PT Assessment Patient needs continued PT services  PT Problem List Pain;Decreased activity tolerance;Decreased knowledge of use of DME;Decreased safety awareness;Decreased mobility;Decreased knowledge of precautions       PT Treatment Interventions DME instruction;Therapeutic activities;Gait training;Therapeutic exercise;Patient/family education;Stair training;Functional mobility training    PT Goals (Current goals can be found in the Care Plan section)  Acute Rehab PT Goals PT Goal Formulation: With patient Time For Goal Achievement: 02/25/18 Potential to Achieve Goals: Good    Frequency 7X/week   Barriers to discharge        Co-evaluation               AM-PAC PT "6 Clicks" Daily Activity  Outcome Measure Difficulty turning over in bed (including adjusting bedclothes, sheets and blankets)?: A Little Difficulty moving from lying on back to sitting on the side of the bed? : A Little Difficulty sitting down on and standing up from a chair with arms (e.g., wheelchair, bedside commode, etc,.)?: A Little Help needed moving to and from a bed to chair (including a wheelchair)?: A Little Help needed walking in hospital room?: A Little Help needed climbing 3-5 steps with a railing? : A Little 6 Click Score: 18    End of Session Equipment Utilized During Treatment: Gait belt Activity Tolerance: Patient tolerated treatment well Patient left: in bed;with call bell/phone within reach;with SCD's reapplied Nurse Communication: Mobility status PT Visit Diagnosis: Other  abnormalities of gait and mobility (R26.89)    Time: 1610-9604 PT Time Calculation (min) (ACUTE ONLY): 30 min   Charges:   PT Evaluation $PT Eval Low Complexity: 1 Low PT Treatments $Therapeutic Activity: 8-22 mins        Nicola Police, PT Acute Rehabilitation Services Pager [phone]  Office [phone]   Tyrone Apple D Despina Hidden 02/11/2018, 6:54 PM

## 2018-02-11 NOTE — Discharge Instructions (Signed)

## 2018-02-11 NOTE — Transfer of Care (Signed)
Immediate Anesthesia Transfer of Care Note  Patient: Lisa Frederick  Procedure(s) Performed: Microlumbar decompression L5-S1 left (Left )  Patient Location: PACU  Anesthesia Type:General  Level of Consciousness: drowsy  Airway & Oxygen Therapy: Patient Spontanous Breathing and Patient connected to face mask oxygen  Post-op Assessment: Report given to RN and Post -op Vital signs reviewed and stable  Post vital signs: Reviewed and stable  Last Vitals:  Vitals Value Taken Time  BP 105/68 02/11/2018  9:56 AM  Temp    Pulse 85 02/11/2018  9:57 AM  Resp 12 02/11/2018  9:57 AM  SpO2 100 % 02/11/2018  9:57 AM  Vitals shown include unvalidated device data.  Last Pain:  Vitals:   02/11/18 0556  TempSrc:   PainSc: 9       Patients Stated Pain Goal: 4 (02/11/18 0556)  Complications: No apparent anesthesia complications

## 2018-02-11 NOTE — Op Note (Signed)
NAME: Lisa Frederick, DIBBERN MEDICAL RECORD [id number] ACCOUNT [id number] DATE OF BIRTH:February 17, 1971 FACILITY: [facility] LOCATION: [facility] PHYSICIAN:Khalila Buechner Connye Burkitt, MD  OPERATIVE REPORT  DATE OF PROCEDURE:  02/11/2018  PREOPERATIVE DIAGNOSES:  Spinal stenosis, herniated nucleus pulposus L5-S1, left, sacralized lumbar vertebral body.  POSTOPERATIVE DIAGNOSES:  Spinal stenosis, herniated nucleus pulposus L5-S1, left, sacralized lumbar vertebral body.    PROCEDURE PERFORMED: 1.  Microlumbar decompression L5-S1, left. 2.  Foraminotomies L5-S1, left. 3.  Microdiskectomy L5-S1, left.  ANESTHESIA:  General.  ASSISTANT:  Andrez Grime, PA   SPECIMEN:  To pathology.  HISTORY:  This is a pleasant 47 year old female with 4 months of left lower extremity radicular pain, worsening in the past month with severe neural tension signs, EHL weakness, plantarflexion weakness, diminished sensation L5-S1 dermatome, and a focal  large disk herniation at L5-S1.  She had a transitional segment associated disk degeneration without Modic changes.  She had no significant back pain or history of disabling back pain.  Failing conservative treatment and the presence of neurologic  deficit, we discussed microlumbar decompression L5-S1.  Risks and benefits discussed including bleeding, infection, damage to neurovascular structures, no change in symptoms, worsening symptoms, DVT, PE, anesthetic complications, etc.  TECHNIQUE:  With the patient in supine position after induction of adequate general anesthesia, 2 g Kefzol, she was placed prone on the Wilson frame.  All bony prominences well padded.  Lumbar region was prepped and draped in the usual sterile fashion.   Two 18-gauge spinal needles were utilized to localize the 5-1 interspace and confirmed with x-ray.  Due to the transitional segment, the L5 spinous process was reflected cephalad.  We injected 0.25% Marcaine with epinephrine in the paraspinous  musculature and  divided in line with the skin incision.  Paraspinous muscle was elevated from the lamina of L5 and S1.  McCulloch retractor was placed.  Operating microscope was draped while in the surgical field.  Confirmatory radiograph obtained.   Again, due to the transitional segment, there was some slightly altered anatomy.  We redirected the [facility] and confirmed the L5-S1 disk space.  She had a large disk herniation compressing the S1 nerve root against the lamina.  We therefore proceeded  by performing a hemilaminotomy of the caudad edge of 5 cephalad to detach the ligamentum flavum, preserving the pars.  I used a micro curette to detach the ligamentum flavum from the cephalad edge of S1.  We then performed a partial medial and facet  technique, approximately 15% of the inferior process of 5, identifying the superior articulating process of S1.  I then used a combination of a micro curette and a Penfield to detach the ligamentum flavum from the cephalad edge of S1.  We then probed  with a Woodson retractor and protected the neural elements and initially performed a generous S1 foraminotomy.  I then identified the S1 nerve root, gently mobilized it medially, protecting it, and then with a curette detached the ligamentum flavum from  the superior articulating process of S1.  Then, with the 2 mm Kerrison decompressed the lateral recess of the medial border of the pedicle, continuing cephalad.  I removed the ligamentum flavum from the interspace.  I then was able to identify the  lateral edge of the thecal sac and a large disk herniation compressing the S1 nerve roots posteriorly.  There was an extensive epidural venous plexus, tethering the S1 nerve root cephalad and caudad.  We utilized bipolar cautery and cauterized the plexus  laterally and divided it, and that  allowed mobilization fully of the S1 nerve root medially with the brachial.  The disk herniation was noted.  I used a spinal needle to confirm that.  I  then made an incision in the disk laterally with a 15 blade and  then mobilized multiple large fragments of disk from the subannular space and within the disk space, multiple passes with a micropituitary.  I further mobilized with an Epstein and a Woodson, preserving the endplates.  Then copiously lavaged disk space  with antibiotic irrigant.  Then made additional multiple passes.  A large residual fragment was noted.  After a full diskectomy of herniated material, we checked lateral to the shoulder of the S1 nerve root, the foramen of L5 and of S1 and the axilla.   No residual disk herniation.  We had undercut the foramen of 5 as well.  There was disk degeneration and stenosis in the superior articulating process and protected the neural elements at all times.  There was an additional epidural venous plexus caudad  out into the foramen.  I then isolated that, gently mobilized the nerve root medially, cauterized the veins laterally and then divided them, further mobilizing the S1 nerve root.  Confirmatory radiographs were obtained.  No evidence of CSF leakage or  active bleeding.  I placed a small thrombin-soaked Gelfoam in the laminotomy defect.  We removed the [facility] retractor.  No active bleeding.  Copiously irrigated the paraspinous musculature and subcutaneous tissue with antibiotic irrigation.  Closed  the dorsal lumbar fascia with #1 Vicryl, subcu with 2-0 and skin with Prolene.  Sterile dressing applied.  Placed supine on the hospital bed, extubated without difficulty and transported to the recovery room in satisfactory condition.  The patient tolerated the procedure well.  No complications.  Assistant Andrez Grime, [place].  Minimal blood loss.  LN/NUANCE  D:02/11/2018 T:02/11/2018 JOB:[id number]

## 2018-02-11 NOTE — Interval H&P Note (Signed)
History and Physical Interval Note:  02/11/2018 7:26 AM  Lisa Frederick  has presented today for surgery, with the diagnosis of HNP L5-S1  The various methods of treatment have been discussed with the patient and family. After consideration of risks, benefits and other options for treatment, the patient has consented to  Procedure(s) with comments: Microlumbar decompression L5-S1 left (Left) - 2 hrs as a surgical intervention .  The patient's history has been reviewed, patient examined, no change in status, stable for surgery.  I have reviewed the patient's chart and labs.  Questions were answered to the patient's satisfaction.     [name] C

## 2018-02-12 ENCOUNTER — Encounter (HOSPITAL_COMMUNITY): Payer: Self-pay | Admitting: Specialist

## 2018-02-12 DIAGNOSIS — M5117 Intervertebral disc disorders with radiculopathy, lumbosacral region: Secondary | ICD-10-CM | POA: Diagnosis not present

## 2018-02-12 NOTE — Discharge Summary (Signed)
Physician Discharge Summary   Patient ID: Marciana Uplinger MRN: [id number] DOB/AGE: Oct 16, 1970 47 y.o.  Admit date: 02/11/2018 Discharge date: 02/12/2018  Primary Diagnosis:   HNP L5-S1  Admission Diagnoses:  History reviewed. No pertinent past medical history. Discharge Diagnoses:   Principal Problem:   HNP (herniated nucleus pulposus), lumbar  Procedure:  Procedure(s) (LRB): Microlumbar decompression L5-S1 left (Left)   Consults: None  HPI:  see H&P    Laboratory Data: Hospital Outpatient Visit on 02/10/2018  Component Date Value Ref Range Status  . Sodium 02/10/2018 142  135 - 145 mmol/L Final  . Potassium 02/10/2018 4.1  3.5 - 5.1 mmol/L Final  . Chloride 02/10/2018 106  98 - 111 mmol/L Final  . CO2 02/10/2018 29  22 - 32 mmol/L Final  . Glucose, Bld 02/10/2018 83  70 - 99 mg/dL Final  . BUN 02/10/2018 21* 6 - 20 mg/dL Final  . Creatinine, Ser 02/10/2018 0.76  0.44 - 1.00 mg/dL Final  . Calcium 02/10/2018 9.5  8.9 - 10.3 mg/dL Final  . GFR calc non Af Amer 02/10/2018 >60  >60 mL/min Final  . GFR calc Af Amer 02/10/2018 >60  >60 mL/min Final   Comment: (NOTE) The eGFR has been calculated using the CKD EPI equation. This calculation has not been validated in all clinical situations. eGFR's persistently <60 mL/min signify possible Chronic Kidney Disease.   Georgiann Hahn gap 02/10/2018 7  5 - 15 Final   Performed at [facility], [place] [address], [place], [place] [address]  . WBC 02/10/2018 6.3  4.0 - 10.5 K/uL Final  . RBC 02/10/2018 3.62* 3.87 - 5.11 MIL/uL Final  . Hemoglobin 02/10/2018 12.7  12.0 - 15.0 g/dL Final  . HCT 02/10/2018 37.3  36.0 - 46.0 % Final  . MCV 02/10/2018 103.0* 78.0 - 100.0 fL Final  . MCH 02/10/2018 35.1* 26.0 - 34.0 pg Final  . MCHC 02/10/2018 34.0  30.0 - 36.0 g/dL Final  . RDW 02/10/2018 12.4  11.5 - 15.5 % Final  . Platelets 02/10/2018 245  150 - 400 K/uL Final   Performed at [facility], [place] [address] [place], [place] [address]  . MRSA, PCR 02/10/2018 NEGATIVE  NEGATIVE Final  . Staphylococcus aureus 02/10/2018 NEGATIVE  NEGATIVE Final   Comment: (NOTE) The Xpert SA Assay (FDA approved for NASAL specimens in patients 46 years of age and older), is one component of a comprehensive surveillance program. It is not intended to diagnose infection nor to guide or monitor treatment. Performed at [facility], [place] [address], [place], [place] [address]   . Preg Test, Ur 02/10/2018 NEGATIVE  NEGATIVE Final   Comment:        THE SENSITIVITY OF THIS METHODOLOGY IS >20 mIU/mL. Performed at [facility], [place] [address] [place], [place] [address]    Recent Labs    02/10/18 0824  HGB 12.7   Recent Labs    02/10/18 0824  WBC 6.3  RBC 3.62*  HCT 37.3  PLT 245   Recent Labs    02/10/18 0824  NA 142  K 4.1  CL 106  CO2 29  BUN 21*  CREATININE 0.76  GLUCOSE 83  CALCIUM 9.5   No results for input(s): LABPT, INR in the last 72 hours.  X-Rays:Dg Lumbar Spine 2-3 Views  Result Date: 02/10/2018 CLINICAL DATA:  Preoperative evaluation.  Disc herniation EXAM: LUMBAR SPINE - 2-3 VIEW COMPARISON:  None. FINDINGS: Frontal and  lateral views were obtained. There are 5 non-rib-bearing lumbar type vertebral bodies. There is a rudimentary S1-2 interspace. No fracture or spondylolisthesis. There is slight disc space narrowing at L5-S1. Other disc spaces appear unremarkable. No erosive change. IMPRESSION: Rudimentary disc space noted at S1-2. Mild disc space narrowing L5-S1. Other disc spaces appear normal. No fracture or spondylolisthesis. Electronically Signed   By: Lowella Grip III M.D.   On: 02/10/2018 13:37   Dg Spine Portable 1 View  Result Date: 02/11/2018 CLINICAL DATA:  Elective surgery of lumbar spine. EXAM: PORTABLE SPINE - 1 VIEW COMPARISON:  Radiograph of same day. FINDINGS: Single intraoperative cross-table lateral projection of the lumbar  spine demonstrates surgical probe position within L5-S1 disc space. IMPRESSION: Surgical localization as described above. Electronically Signed   By: Marijo Conception, M.D.   On: 02/11/2018 09:45   Dg Spine Portable 1 View  Result Date: 02/11/2018 CLINICAL DATA:  Intraoperative localization EXAM: PORTABLE SPINE - 1 VIEW COMPARISON:  Film from earlier in the same day. FINDINGS: Lateral image of the lumbar spine reveals surgical instruments and retractors again at the L5-S1 interspace. Numbering nomenclature is similar to that utilized on the prior exam. Linear density is noted superimposed over the lower sacrum likely extrinsic to the patient is it was not seen on prior intraoperative films. IMPRESSION: Intraoperative localization as described. Metallic density overlying the lower sacrum and coccyx likely extrinsic to the patient. Electronically Signed   By: Inez Catalina M.D.   On: 02/11/2018 08:34   Dg Spine Portable 1 View  Result Date: 02/11/2018 CLINICAL DATA:  Intraoperative localization EXAM: PORTABLE SPINE - 1 VIEW COMPARISON:  Lateral radiograph from earlier in the same day FINDINGS: Surgical retractors and instruments are now seen posterior to the L5-S1 interspace. The numbering nomenclature is similar to that utilized on prior exams. IMPRESSION: Intraoperative localization at L5-S1 Electronically Signed   By: Inez Catalina M.D.   On: 02/11/2018 08:33   Dg Spine Portable 1 View  Result Date: 02/11/2018 CLINICAL DATA:  Disc herniation.  Preoperative evaluation. EXAM: PORTABLE SPINE - 1 VIEW COMPARISON:  Radiographs of February 10, 2018. FINDINGS: Single intraoperative cross-table lateral projection of the lumbar spine demonstrates surgical probes directed at the posterior elements of S1 and S2. IMPRESSION: Surgical localization as described above. Electronically Signed   By: Marijo Conception, M.D.   On: 02/11/2018 08:17    EKG:No orders found for this or any previous visit.   Hospital Course:  Patient was admitted to [facility] and taken to the OR and underwent the above state procedure without complications.  Patient tolerated the procedure well and was later transferred to the recovery room and then to the orthopaedic floor for postoperative care.  They were given PO and IV analgesics for pain control following their surgery.  They were given 24 hours of postoperative antibiotics.   PT was consulted postop to assist with mobility and transfers.  The patient was allowed to be WBAT with therapy and was taught back precautions. Discharge planning was consulted to help with postop disposition and equipment needs.  Patient had a good night on the evening of surgery and started to get up OOB with therapy on day one. Patient was seen in rounds and was ready to go home on day one.  They were given discharge instructions and dressing directions.  They were instructed on when to follow up in the office with Dr. Tonita Cong.   Diet: Regular diet Activity:WBAT, LSpine precautions Follow-up:in  10-14 days Disposition - Home Discharged Condition: good   Discharge Instructions    Call MD / Call 911   Complete by:  As directed    If you experience chest pain or shortness of breath, CALL 911 and be transported to the hospital emergency room.  If you develope a fever above 101 F, pus (white drainage) or increased drainage or redness at the wound, or calf pain, call your surgeon's office.   Constipation Prevention   Complete by:  As directed    Drink plenty of fluids.  Prune juice may be helpful.  You may use a stool softener, such as Colace (over the counter) 100 mg twice a day.  Use MiraLax (over the counter) for constipation as needed.   Diet - low sodium heart healthy   Complete by:  As directed    Increase activity slowly as tolerated   Complete by:  As directed      Allergies as of 02/12/2018   No Known Allergies     Medication List    STOP taking these medications     oxyCODONE-acetaminophen 5-325 MG tablet Commonly known as:  PERCOCET/ROXICET   predniSONE 5 MG tablet Commonly known as:  DELTASONE     TAKE these medications   docusate sodium 100 MG capsule Commonly known as:  COLACE Take 1 capsule (100 mg total) by mouth 2 (two) times daily.   gabapentin 300 MG capsule Commonly known as:  NEURONTIN Take 300 mg by mouth 2 (two) times daily as needed (for pain/neuropathy).   methocarbamol 500 MG tablet Commonly known as:  ROBAXIN Take 1 tablet (500 mg total) by mouth every 6 (six) hours as needed for muscle spasms.   oxyCODONE 5 MG immediate release tablet Commonly known as:  Oxy IR/ROXICODONE Take 1-2 tablets (5-10 mg total) by mouth every 4 (four) hours as needed.   polyethylene glycol packet Commonly known as:  MIRALAX / GLYCOLAX Take 17 g by mouth daily.      Follow-up Information    Susa Day, MD Follow up in 2 week(s).   Specialty:  Orthopedic Surgery Contact information: [address] [place] [address] [phone]           Signed: Lacie Draft, PA-C Orthopaedic Surgery 02/12/2018, 9:31 AM

## 2018-02-12 NOTE — Care Management Note (Signed)
Case Management Note  Patient Details  Name: Lisa Frederick MRN: [id number] Date of Birth: February 24, 1971  Subjective/Objective:    No d/c needs identified.                 Action/Plan:   Expected Discharge Date:  02/12/18               Expected Discharge Plan:  Home/Self Care  In-House Referral:  NA  Discharge planning Services  CM Consult  Post Acute Care Choice:  NA Choice offered to:  NA  DME Arranged:  N/A DME Agency:  NA  HH Arranged:  NA HH Agency:  NA  Status of Service:  Completed, signed off  If discussed at Long Length of Stay Meetings, dates discussed:    Additional Comments:  Alexis Goodell, RN 02/12/2018, 8:17 AM

## 2018-02-12 NOTE — Progress Notes (Signed)
Physical Therapy Treatment Patient Details Name: Lisa Frederick MRN: [id number] DOB: 1970-07-24 Today's Date: 02/12/2018    History of Present Illness Pt is a 47 YO female s/p microlumbar decompression, foraminotomies, and microdiskectomy of L5-S1 level. PMH and surgical history unremarkable.     PT Comments    POD # 1 Pt c/o increased back pain today > yesterday.   Assisted OOB to amb full unit using a RW for safety, performed stairs, assisted with toilet transfer then assisted back to bed and applied ICE.  Pt given a back booklet and instructed on her back precautions and suggested activity level at home.    Follow Up Recommendations  Follow surgeon's recommendation for DC plan and follow-up therapies;Supervision for mobility/OOB     Equipment Recommendations  None recommended by PT    Recommendations for Other Services       Precautions / Restrictions Precautions Precautions: Fall;Back Precaution Booklet Issued: Yes (comment) Precaution Comments: back booklet issued Restrictions Weight Bearing Restrictions: No    Mobility  Bed Mobility Overal bed mobility: Needs Assistance Bed Mobility: Rolling;Sidelying to Sit;Sit to Sidelying Rolling: Supervision Sidelying to sit: Supervision     Sit to sidelying: Supervision General bed mobility comments: increased time with Supervision assist pillows/blankets.  "Log Roll" tech with much effort with limited sitting tolerance EOB due to pain.  Transfers Overall transfer level: Needs assistance Equipment used: None Transfers: Sit to/from Stand Sit to Stand: Supervision         General transfer comment: Supervison for safety and inctruction on back precautions.  Performed bed and toilet transfer with increased time.  Ambulation/Gait Ambulation/Gait assistance: Supervision Gait Distance (Feet): 350 Feet Assistive device: Rolling walker (2 wheeled);None Gait Pattern/deviations: Step-through pattern Gait velocity: decreased    General Gait Details: used a walker in hallway for safety and due to increased pain > yesterday.  Amb without AD in room with VC's safety navigating around furniture.    Stairs Stairs: Yes Stairs assistance: Supervision;Min guard Stair Management: No rails;Alternating pattern;Forwards Number of Stairs: 2 General stair comments: pt was able to perform with alternating steps and HHA forward due to NO rails.    Wheelchair Mobility    Modified Rankin (Stroke Patients Only)       Balance Overall balance assessment: Modified Independent                                          Cognition Arousal/Alertness: Awake/alert Behavior During Therapy: WFL for tasks assessed/performed Overall Cognitive Status: Within Functional Limits for tasks assessed                                        Exercises      General Comments        Pertinent Vitals/Pain Pain Assessment: 0-10 Pain Score: 6  Pain Location: back  Pain Descriptors / Indicators: Discomfort;Guarding;Operative site guarding Pain Intervention(s): Monitored during session;Repositioned;Ice applied;Patient requesting pain meds-RN notified    Home Living Family/patient expects to be discharged to:: Private residence Living Arrangements: Spouse/significant other Available Help at Discharge: Family;Available PRN/intermittently Type of Home: House Home Access: Stairs to enter Entrance Stairs-Rails: None Home Layout: Two level;Able to live on main level with bedroom/bathroom Home Equipment: None      Prior Function Level of Independence: Independent  PT Goals (current goals can now be found in the care plan section) Acute Rehab PT Goals Patient Stated Goal: home today Progress towards PT goals: Progressing toward goals    Frequency    7X/week      PT Plan Current plan remains appropriate    Co-evaluation              AM-PAC PT "6 Clicks" Daily Activity   Outcome Measure  Difficulty turning over in bed (including adjusting bedclothes, sheets and blankets)?: A Little Difficulty moving from lying on back to sitting on the side of the bed? : A Little Difficulty sitting down on and standing up from a chair with arms (e.g., wheelchair, bedside commode, etc,.)?: A Little Help needed moving to and from a bed to chair (including a wheelchair)?: A Little Help needed walking in hospital room?: A Little Help needed climbing 3-5 steps with a railing? : A Little 6 Click Score: 18    End of Session Equipment Utilized During Treatment: Gait belt Activity Tolerance: Patient tolerated treatment well Patient left: in bed;with call bell/phone within reach;with SCD's reapplied Nurse Communication: Mobility status(pt ready for D/C about noon) PT Visit Diagnosis: Other abnormalities of gait and mobility (R26.89)     Time: 1000-1030 PT Time Calculation (min) (ACUTE ONLY): 30 min  Charges:  $Gait Training: 8-22 mins $Therapeutic Activity: 8-22 mins                     Felecia Shelling  PTA Acute  Rehabilitation Services Pager      [phone] Office      [phone]

## 2018-02-12 NOTE — Progress Notes (Signed)
Subjective: 1 Day Post-Op Procedure(s) (LRB): Microlumbar decompression L5-S1 left (Left) Patient reports pain as moderate.   \Seen by Dr. Shelle Iron in AM rounds. Doing well. No leg pain. Still some weakness but improved. Voiding without difficulty. Feels ready to go home today.  Objective: Vital signs in last 24 hours: Temp:  [97.5 F (36.4 C)-98.5 F (36.9 C)] 98.1 F (36.7 C) (09/26 0624) Pulse Rate:  [53-84] 63 (09/26 0624) Resp:  [12-20] 16 (09/26 0624) BP: (85-116)/(52-77) 99/64 (09/26 0624) SpO2:  [99 %-100 %] 100 % (09/26 0624)  Intake/Output from previous day: 09/25 0701 - 09/26 0700 In: 3012.5 [P.O.:625; I.V.:2187.5; IV Piggyback:200] Out: 1100 [Urine:1050; Blood:50] Intake/Output this shift: No intake/output data recorded.  Recent Labs    02/10/18 0824  HGB 12.7   Recent Labs    02/10/18 0824  WBC 6.3  RBC 3.62*  HCT 37.3  PLT 245   Recent Labs    02/10/18 0824  NA 142  K 4.1  CL 106  CO2 29  BUN 21*  CREATININE 0.76  GLUCOSE 83  CALCIUM 9.5   No results for input(s): LABPT, INR in the last 72 hours.  Neurologically intact ABD soft Neurovascular intact Sensation intact distally Intact pulses distally Dorsiflexion/Plantar flexion intact Incision: dressing C/D/I and no drainage No cellulitis present Compartment soft trace EHL weakness on exam but improved from pre-op   Assessment/Plan: 1 Day Post-Op Procedure(s) (LRB): Microlumbar decompression L5-S1 left (Left) Advance diet Up with therapy D/C IV fluids Discussed D/C instructions, Lspine precautions, dressing instructions D/C home today Follow up outpt in 2 weeks for suture removal   BISSELL, JACLYN M. 02/12/2018, 7:43 AM

## 2018-02-12 NOTE — Plan of Care (Signed)
Pt is stable. Plane of care reviewed with pt. Pain management in progress, effective.

## 2018-02-12 NOTE — Evaluation (Signed)
Occupational Therapy Evaluation Patient Details Name: Lisa Frederick MRN: [id number] DOB: [birth year] Today's Date: 02/12/2018    History of Present Illness Pt is a 47 YO female s/p microlumbar decompression, foraminotomies, and microdiskectomy of L5-S1 level. PMH and surgical history unremarkable.    Clinical Impression   OT education complete    Follow Up Recommendations  No OT follow up    Equipment Recommendations  None recommended by OT    Recommendations for Other Services       Precautions / Restrictions Restrictions Weight Bearing Restrictions: No      Mobility Bed Mobility Overal bed mobility: Modified Independent                Transfers Overall transfer level: Modified independent                    Balance Overall balance assessment: Modified Independent                                         ADL either performed or assessed with clinical judgement   ADL Overall ADL's : Modified independent                                       General ADL Comments: Education provided regarding back precautions and ADL activity,  Pt able to verbalize and demonstrate understanding of no bending, twisting and arching with ADL activity      Vision Patient Visual Report: No change from baseline       Perception     Praxis      Pertinent Vitals/Pain Pain Score: 3  Pain Location: back  Pain Descriptors / Indicators: Discomfort Pain Intervention(s): Limited activity within patient's tolerance     Hand Dominance Right   Extremity/Trunk Assessment             Communication Communication Communication: No difficulties   Cognition Arousal/Alertness: Awake/alert Behavior During Therapy: WFL for tasks assessed/performed;Anxious Overall Cognitive Status: Within Functional Limits for tasks assessed                                     General Comments       Exercises     Shoulder Instructions       Home Living Family/patient expects to be discharged to:: Private residence Living Arrangements: Spouse/significant other Available Help at Discharge: Family;Available PRN/intermittently Type of Home: House Home Access: Stairs to enter [employer] of Steps: 2 in the front, 5 in the garage area  Entrance Stairs-Rails: None Home Layout: Two level;Able to live on main level with bedroom/bathroom     Bathroom Shower/Tub: Producer, television/film/video: Standard     Home Equipment: None          Prior Functioning/Environment Level of Independence: Independent                 OT Problem List:        OT Treatment/Interventions:      OT Goals(Current goals can be found in the care plan section) Acute Rehab OT Goals Patient Stated Goal: home today OT Goal Formulation: With patient  OT Frequency:     Barriers to D/C:  Co-evaluation              AM-PAC PT "6 Clicks" Daily Activity     Outcome Measure Help from another person eating meals?: None Help from another person taking care of personal grooming?: None Help from another person toileting, which includes using toliet, bedpan, or urinal?: None Help from another person bathing (including washing, rinsing, drying)?: None Help from another person to put on and taking off regular upper body clothing?: None Help from another person to put on and taking off regular lower body clothing?: None 6 Click Score: 24   End of Session Nurse Communication: Mobility status  Activity Tolerance: Patient tolerated treatment well Patient left:                     Time: 1610-9604 OT Time Calculation (min): 18 min Charges:  OT General Charges $OT Visit: 1 Visit OT Evaluation $OT Eval Low Complexity: 1 Low  Lise Auer, OT Acute Rehabilitation Services [phone] Office- [phone]    Eular Panek, Karin Golden D 02/12/2018, 12:10 PM

## 2018-08-04 ENCOUNTER — Other Ambulatory Visit: Payer: Self-pay

## 2018-08-04 ENCOUNTER — Encounter: Payer: Self-pay | Admitting: Obstetrics & Gynecology

## 2018-08-04 ENCOUNTER — Ambulatory Visit (INDEPENDENT_AMBULATORY_CARE_PROVIDER_SITE_OTHER): Payer: 59 | Admitting: Obstetrics & Gynecology

## 2018-08-04 VITALS — BP 116/74 | Ht 65.0 in | Wt 122.0 lb

## 2018-08-04 DIAGNOSIS — Z9189 Other specified personal risk factors, not elsewhere classified: Secondary | ICD-10-CM | POA: Diagnosis not present

## 2018-08-04 DIAGNOSIS — Z1151 Encounter for screening for human papillomavirus (HPV): Secondary | ICD-10-CM | POA: Diagnosis not present

## 2018-08-04 DIAGNOSIS — Z01419 Encounter for gynecological examination (general) (routine) without abnormal findings: Secondary | ICD-10-CM

## 2018-08-04 NOTE — Progress Notes (Signed)
Lisa Frederick [birth year] [id number]   History:    48 y.o. G3P3L3 Married.  Vasectomy.  Children 05-03-15 yo  RP:  New patient presenting for annual gyn exam   HPI:  Menses regular normal every month.  No BTB.  No pelvic pain.  Normal vaginal secretions.  No pain with intercourse.  Vasectomy.  Urine and bowel movements normal.  Breasts normal.  Body mass index good at 20.3.  Enjoys Scientist, clinical (histocompatibility and immunogenetics) and working out with the [employer].  We will follow-up here for fasting health labs.  Past medical history,surgical history, family history and social history were all reviewed and documented in the EPIC chart.  Gynecologic History Patient's last menstrual period was 08/04/2018. Contraception: vasectomy Last Pap: 2018. Results were: normal Last mammogram: 2018. Results were: normal Bone Density: Never Colonoscopy: Will refer to Gastro for 1st Colono  Obstetric History OB History  Gravida Para Term Preterm AB Living  _0 SAB TAB Ectopic Multiple Live Births               # Outcome Date GA Lbr Len/2nd Weight Sex Delivery Anes PTL Lv  3 Para           2 Para           1 Para              ROS: A ROS was performed and pertinent positives and negatives are included in the history.  GENERAL: No fevers or chills. HEENT: No change in vision, no earache, sore throat or sinus congestion. NECK: No pain or stiffness. CARDIOVASCULAR: No chest pain or pressure. No palpitations. PULMONARY: No shortness of breath, cough or wheeze. GASTROINTESTINAL: No abdominal pain, nausea, vomiting or diarrhea, melena or bright red blood per rectum. GENITOURINARY: No urinary frequency, urgency, hesitancy or dysuria. MUSCULOSKELETAL: No joint or muscle pain, no back pain, no recent trauma. DERMATOLOGIC: No rash, no itching, no lesions. ENDOCRINE: No polyuria, polydipsia, no heat or cold intolerance. No recent change in weight. HEMATOLOGICAL: No anemia or easy bruising or bleeding. NEUROLOGIC: No headache, seizures,  numbness, tingling or weakness. PSYCHIATRIC: No depression, no loss of interest in normal activity or change in sleep pattern.     Exam:   BP 116/74   Ht _1  (1.651 m)   Wt 122 lb (55.3 kg)   LMP 08/04/2018   BMI 20.30 kg/m   Body mass index is 20.3 kg/m.  General appearance : Well developed well nourished female. No acute distress HEENT: Eyes: no retinal hemorrhage or exudates,  Neck supple, trachea midline, no carotid bruits, no thyroidmegaly Lungs: Clear to auscultation, no rhonchi or wheezes, or rib retractions  Heart: Regular rate and rhythm, no murmurs or gallops Breast:Examined in sitting and supine position were symmetrical in appearance, no palpable masses or tenderness,  no skin retraction, no nipple inversion, no nipple discharge, no skin discoloration, no axillary or supraclavicular lymphadenopathy Abdomen: no palpable masses or tenderness, no rebound or guarding Extremities: no edema or skin discoloration or tenderness  Pelvic: Vulva: Normal             Vagina: No gross lesions or discharge  Cervix: No gross lesions or discharge.  Pap/HPV HR done.  Uterus  AV, normal size, shape and consistency, non-tender and mobile  Adnexa  Without masses or tenderness  Anus: Normal   Assessment/Plan:  48 y.o. female for annual exam   1. Encounter for routine gynecological examination with Papanicolaou  smear of cervix Normal gynecologic exam.  Pap with high-risk HPV done today.  Breast exam normal.  Will schedule a screening mammogram at the breast center.  Follow-up here for fasting health labs.  Message sent to organize a referral with gastroenterology for her first screening colonoscopy in May at age 89.  Good body mass index at 20.3.  Continue with fitness and healthy nutrition. - CBC; Future - Comp Met (CMET); Future - Lipid panel; Future - TSH; Future - VITAMIN D 25 Hydroxy (Vit-D Deficiency, Fractures); Future  2. Relies on partner vasectomy for contraception   Princess Bruins MD, 11:20 AM 08/04/2018

## 2018-08-04 NOTE — Patient Instructions (Signed)
1. Encounter for routine gynecological examination with Papanicolaou smear of cervix Normal gynecologic exam.  Pap with high-risk HPV done today.  Breast exam normal.  Will schedule a screening mammogram at the breast center.  Follow-up here for fasting health labs.  Message sent to organize a referral with gastroenterology for her first screening colonoscopy in May at age 48.  Good body mass index at 20.3.  Continue with fitness and healthy nutrition. - CBC; Future - Comp Met (CMET); Future - Lipid panel; Future - TSH; Future - VITAMIN D 25 Hydroxy (Vit-D Deficiency, Fractures); Future  2. Relies on partner vasectomy for contraception  Terris, it was a pleasure meeting you today!  I will inform you of your results as soon as they are available.

## 2018-08-05 LAB — PAP, TP IMAGING W/ HPV RNA, RFLX HPV TYPE 16,18/45: HPV DNA HIGH RISK: NOT DETECTED

## 2019-01-27 ENCOUNTER — Encounter: Payer: Self-pay | Admitting: Gastroenterology

## 2019-01-27 ENCOUNTER — Other Ambulatory Visit: Payer: Self-pay | Admitting: Obstetrics & Gynecology

## 2019-01-27 DIAGNOSIS — Z1231 Encounter for screening mammogram for malignant neoplasm of breast: Secondary | ICD-10-CM

## 2019-02-02 ENCOUNTER — Other Ambulatory Visit: Payer: Self-pay

## 2019-02-02 ENCOUNTER — Other Ambulatory Visit: Payer: 59

## 2019-02-02 DIAGNOSIS — Z01419 Encounter for gynecological examination (general) (routine) without abnormal findings: Secondary | ICD-10-CM

## 2019-02-02 LAB — LIPID PANEL
Cholesterol: 178 mg/dL (ref <200)
HDL: 72 mg/dL (ref >=50)
LDL Cholesterol (Calc): 88 mg/dL (calc)
Non-HDL Cholesterol (Calc): 106 mg/dL (calc) (ref <130)
Total CHOL/HDL Ratio: 2.5 (calc) (ref <5.0)
Triglycerides: 85 mg/dL (ref <150)

## 2019-02-02 LAB — VITAMIN D 25 HYDROXY (VIT D DEFICIENCY, FRACTURES): Vit D, 25-Hydroxy: 37 ng/mL (ref 30–100)

## 2019-02-02 LAB — COMPREHENSIVE METABOLIC PANEL
AG Ratio: 2.1 (calc) (ref 1.0–2.5)
ALT: 16 U/L (ref 6–29)
AST: 22 U/L (ref 10–35)
Albumin: 4.9 g/dL (ref 3.6–5.1)
Alkaline phosphatase (APISO): 41 U/L (ref 37–153)
BUN: 13 mg/dL (ref 7–25)
CO2: 27 mmol/L (ref 20–32)
Calcium: 9.7 mg/dL (ref 8.6–10.4)
Chloride: 103 mmol/L (ref 98–110)
Creat: 0.8 mg/dL (ref 0.50–1.05)
Globulin: 2.3 g/dL (calc) (ref 1.9–3.7)
Glucose, Bld: 88 mg/dL (ref 65–99)
Potassium: 4.1 mmol/L (ref 3.5–5.3)
Sodium: 140 mmol/L (ref 135–146)
Total Bilirubin: 0.9 mg/dL (ref 0.2–1.2)
Total Protein: 7.2 g/dL (ref 6.1–8.1)

## 2019-02-02 LAB — CBC
HCT: 39.9 % (ref 35.0–45.0)
Hemoglobin: 13.7 g/dL (ref 11.7–15.5)
MCH: 35.4 pg — ABNORMAL HIGH (ref 27.0–33.0)
MCHC: 34.3 g/dL (ref 32.0–36.0)
MCV: 103.1 fL — ABNORMAL HIGH (ref 80.0–100.0)
MPV: 10.1 fL (ref 7.5–12.5)
Platelets: 261 10*3/uL (ref 140–400)
RBC: 3.87 10*6/uL (ref 3.80–5.10)
RDW: 11.8 % (ref 11.0–15.0)
WBC: 4.8 10*3/uL (ref 3.8–10.8)

## 2019-02-02 LAB — TSH: TSH: 3.19 mIU/L

## 2019-02-12 ENCOUNTER — Other Ambulatory Visit: Payer: Self-pay

## 2019-02-12 ENCOUNTER — Encounter: Payer: Self-pay | Admitting: Gastroenterology

## 2019-02-12 ENCOUNTER — Ambulatory Visit (AMBULATORY_SURGERY_CENTER): Payer: Self-pay | Admitting: *Deleted

## 2019-02-12 VITALS — Temp 96.9°F | Ht 65.0 in | Wt 126.0 lb

## 2019-02-12 DIAGNOSIS — Z1211 Encounter for screening for malignant neoplasm of colon: Secondary | ICD-10-CM

## 2019-02-12 MED ORDER — PEG 3350-KCL-NA BICARB-NACL 420 G PO SOLR: 4000.0000 mL | Freq: Once | ORAL | 0 refills | Status: AC

## 2019-02-12 NOTE — Progress Notes (Signed)

## 2019-02-18 HISTORY — PX: CERVICAL SPINE SURGERY: SHX589

## 2019-02-26 ENCOUNTER — Encounter: Payer: 59 | Admitting: Gastroenterology

## 2019-03-01 ENCOUNTER — Other Ambulatory Visit: Payer: Self-pay | Admitting: Orthopedic Surgery

## 2019-03-03 ENCOUNTER — Ambulatory Visit: Payer: 59 | Admitting: Family Medicine

## 2019-03-16 ENCOUNTER — Ambulatory Visit: Payer: 59

## 2019-04-21 ENCOUNTER — Ambulatory Visit (AMBULATORY_SURGERY_CENTER): Payer: 59 | Admitting: Gastroenterology

## 2019-04-21 ENCOUNTER — Other Ambulatory Visit: Payer: Self-pay

## 2019-04-21 ENCOUNTER — Encounter: Payer: Self-pay | Admitting: Gastroenterology

## 2019-04-21 VITALS — BP 95/53 | HR 74 | Temp 98.3°F | Resp 21 | Ht 65.0 in | Wt 126.0 lb

## 2019-04-21 DIAGNOSIS — Z1211 Encounter for screening for malignant neoplasm of colon: Secondary | ICD-10-CM | POA: Diagnosis not present

## 2019-04-21 MED ORDER — SODIUM CHLORIDE 0.9 % IV SOLN: 500.0000 mL | Freq: Once | INTRAVENOUS | Status: DC

## 2019-04-21 NOTE — Progress Notes (Signed)
Report given to PACU, vss 

## 2019-04-21 NOTE — Patient Instructions (Signed)
YOU HAD AN ENDOSCOPIC PROCEDURE TODAY AT THE [facility]:   Refer to the procedure report that was given to you for any specific questions about what was found during the examination.  If the procedure report does not answer your questions, please call your gastroenterologist to clarify.  If you requested that your care partner not be given the details of your procedure findings, then the procedure report has been included in a sealed envelope for you to review at your convenience later.  YOU SHOULD EXPECT: Some feelings of bloating in the abdomen. Passage of more gas than usual.  Walking can help get rid of the air that was put into your GI tract during the procedure and reduce the bloating. If you had a lower endoscopy (such as a colonoscopy or flexible sigmoidoscopy) you may notice spotting of blood in your stool or on the toilet paper. If you underwent a bowel prep for your procedure, you may not have a normal bowel movement for a few days.  Please Note:  You might notice some irritation and congestion in your nose or some drainage.  This is from the oxygen used during your procedure.  There is no need for concern and it should clear up in a day or so.  SYMPTOMS TO REPORT IMMEDIATELY:   Following lower endoscopy (colonoscopy or flexible sigmoidoscopy):  Excessive amounts of blood in the stool  Significant tenderness or worsening of abdominal pains  Swelling of the abdomen that is new, acute  Fever of 100F or higher  For urgent or emergent issues, a gastroenterologist can be reached at any hour by calling [phone].   DIET:  We do recommend a small meal at first, but then you may proceed to your regular diet.  Drink plenty of fluids but you should avoid alcoholic beverages for 24 hours.  ACTIVITY:  You should plan to take it easy for the rest of today and you should NOT DRIVE or use heavy machinery until tomorrow (because of the sedation medicines used during the test).     FOLLOW UP: Our staff will call the number listed on your records 48-72 hours following your procedure to check on you and address any questions or concerns that you may have regarding the information given to you following your procedure. If we do not reach you, we will leave a message.  We will attempt to reach you two times.  During this call, we will ask if you have developed any symptoms of COVID 19. If you develop any symptoms (ie: fever, flu-like symptoms, shortness of breath, cough etc.) before then, please call [phone].  If you test positive for Covid 19 in the 2 weeks post procedure, please call and report this information to us.    If any biopsies were taken you will be contacted by phone or by letter within the next 1-3 weeks.  Please call us at [phone] if you have not heard about the biopsies in 3 weeks.    SIGNATURES/CONFIDENTIALITY: You and/or your care partner have signed paperwork which will be entered into your electronic medical record.  These signatures attest to the fact that that the information above on your After Visit Summary has been reviewed and is understood.  Full responsibility of the confidentiality of this discharge information lies with you and/or your care-partner. 

## 2019-04-21 NOTE — Op Note (Signed)
[facility] Patient Name: Lisa Frederick Procedure Date: 04/21/2019 8:51 AM MRN: [id number] Endoscopist: Milus Banister , MD Age: 48 Referring MD:  Date of Birth: 1971-04-29 Gender: Female Account #: [id number] Procedure:                Colonoscopy Indications:              Screening for colorectal malignant neoplasm Medicines:                Monitored Anesthesia Care Procedure:                Pre-Anesthesia Assessment:                           - Prior to the procedure, a History and Physical                            was performed, and patient medications and                            allergies were reviewed. The patient's tolerance of                            previous anesthesia was also reviewed. The risks                            and benefits of the procedure and the sedation                            options and risks were discussed with the patient.                            All questions were answered, and informed consent                            was obtained. Prior Anticoagulants: The patient has                            taken no previous anticoagulant or antiplatelet                            agents. ASA Grade Assessment: II - A patient with                            mild systemic disease. After reviewing the risks                            and benefits, the patient was deemed in                            satisfactory condition to undergo the procedure.                           After obtaining informed consent, the colonoscope  was passed under direct vision. Throughout the                            procedure, the patient's blood pressure, pulse, and                            oxygen saturations were monitored continuously. The                            Colonoscope was introduced through the anus and                            advanced to the the cecum, identified by                            appendiceal orifice and  ileocecal valve. The                            colonoscopy was performed without difficulty. The                            patient tolerated the procedure well. The quality                            of the bowel preparation was good. The ileocecal                            valve, appendiceal orifice, and rectum were                            photographed. Scope In: 8:54:16 AM Scope Out: 9:07:31 AM Scope Withdrawal Time: 0 hours 8 minutes 4 seconds  Total Procedure Duration: 0 hours 13 minutes 15 seconds  Findings:                 The entire examined colon appeared normal on direct                            and retroflexion views. Complications:            No immediate complications. Estimated blood loss:                            None. Estimated Blood Loss:     Estimated blood loss: none. Impression:               - The entire examined colon is normal on direct and                            retroflexion views.                           - No polyps or cancers. Recommendation:           - Patient has a contact number available for  emergencies. The signs and symptoms of potential                            delayed complications were discussed with the                            patient. Return to normal activities tomorrow.                            Written discharge instructions were provided to the                            patient.                           - Resume previous diet.                           - Continue present medications.                           - Repeat colonoscopy in 10 years for screening. Rachael Feeaniel P , MD 04/21/2019 9:09:12 AM This report has been signed electronically.

## 2019-04-23 ENCOUNTER — Telehealth: Payer: Self-pay

## 2019-04-23 NOTE — Telephone Encounter (Signed)
  Follow up Call-  Call back number 04/21/2019  Post procedure Call Back phone  # [phone]  Permission to leave phone message Yes     Patient questions:  Do you have a fever, pain , or abdominal swelling? No. Pain Score  0 *  Have you tolerated food without any problems? Yes.    Have you been able to return to your normal activities? Yes.    Do you have any questions about your discharge instructions: Diet   No. Medications  No. Follow up visit  No.  Do you have questions or concerns about your Care? No.  Actions: * If pain score is 4 or above: No action needed, pain <4. 1. Have you developed a fever since your procedure? no  2.   Have you had an respiratory symptoms (SOB or cough) since your procedure? no  3.   Have you tested positive for COVID 19 since your procedure no  4.   Have you had any family members/close contacts diagnosed with the COVID 19 since your procedure?  no   If yes to any of these questions please route to Joylene John, RN and Alphonsa Gin, Therapist, sports.

## 2019-04-30 ENCOUNTER — Ambulatory Visit
Admission: RE | Admit: 2019-04-30 | Discharge: 2019-04-30 | Disposition: A | Discharge status: Home / Self Care | Payer: 59 | Source: Ambulatory Visit | Attending: Obstetrics & Gynecology | Admitting: Obstetrics & Gynecology

## 2019-04-30 ENCOUNTER — Other Ambulatory Visit: Payer: Self-pay

## 2019-04-30 DIAGNOSIS — Z1231 Encounter for screening mammogram for malignant neoplasm of breast: Secondary | ICD-10-CM

## 2019-05-25 ENCOUNTER — Other Ambulatory Visit: Payer: Self-pay | Admitting: Obstetrics & Gynecology

## 2019-05-25 DIAGNOSIS — R928 Other abnormal and inconclusive findings on diagnostic imaging of breast: Secondary | ICD-10-CM

## 2019-05-27 ENCOUNTER — Ambulatory Visit: Payer: 59

## 2019-05-27 ENCOUNTER — Other Ambulatory Visit: Payer: Self-pay

## 2019-05-27 ENCOUNTER — Ambulatory Visit
Admission: RE | Admit: 2019-05-27 | Discharge: 2019-05-27 | Disposition: A | Discharge status: Home / Self Care | Payer: 59 | Source: Ambulatory Visit | Attending: Obstetrics & Gynecology | Admitting: Obstetrics & Gynecology

## 2019-05-27 DIAGNOSIS — R928 Other abnormal and inconclusive findings on diagnostic imaging of breast: Secondary | ICD-10-CM

## 2019-08-25 ENCOUNTER — Other Ambulatory Visit: Payer: Self-pay

## 2019-08-26 ENCOUNTER — Ambulatory Visit (INDEPENDENT_AMBULATORY_CARE_PROVIDER_SITE_OTHER): Payer: 59 | Admitting: Obstetrics & Gynecology

## 2019-08-26 ENCOUNTER — Encounter: Payer: Self-pay | Admitting: Obstetrics & Gynecology

## 2019-08-26 VITALS — BP 102/68 | Ht 64.5 in | Wt 119.0 lb

## 2019-08-26 DIAGNOSIS — Z01419 Encounter for gynecological examination (general) (routine) without abnormal findings: Secondary | ICD-10-CM | POA: Diagnosis not present

## 2019-08-26 DIAGNOSIS — Z9189 Other specified personal risk factors, not elsewhere classified: Secondary | ICD-10-CM | POA: Diagnosis not present

## 2019-08-26 NOTE — Progress Notes (Signed)
Lisa Frederick [birth year] [id number]   History:    49 y.o. G3P3L3 Married.  Vasectomy.  Children 13-15-17 yo  RP: Established patient presenting for annual gyn exam   HPI:  Menses regular normal every month.  No BTB.  No pelvic pain.  Normal vaginal secretions.  No pain with intercourse.  Vasectomy.  Urine and bowel movements normal.  Breasts normal.  Body mass index good at 20.11.  Enjoys Scientist, clinical (histocompatibility and immunogenetics) and working out with the [employer].  Fasting health labs here in 01/2019 were completely normal.   Past medical history,surgical history, family history and social history were all reviewed and documented in the EPIC chart.  Gynecologic History Patient's last menstrual period was 08/14/2019.  Obstetric History OB History  Gravida Para Term Preterm AB Living  3 3       3   SAB TAB Ectopic Multiple Live Births               # Outcome Date GA Lbr Len/2nd Weight Sex Delivery Anes PTL Lv  3 Para           2 Para           1 Para              ROS: A ROS was performed and pertinent positives and negatives are included in the history.  GENERAL: No fevers or chills. HEENT: No change in vision, no earache, sore throat or sinus congestion. NECK: No pain or stiffness. CARDIOVASCULAR: No chest pain or pressure. No palpitations. PULMONARY: No shortness of breath, cough or wheeze. GASTROINTESTINAL: No abdominal pain, nausea, vomiting or diarrhea, melena or bright red blood per rectum. GENITOURINARY: No urinary frequency, urgency, hesitancy or dysuria. MUSCULOSKELETAL: No joint or muscle pain, no back pain, no recent trauma. DERMATOLOGIC: No rash, no itching, no lesions. ENDOCRINE: No polyuria, polydipsia, no heat or cold intolerance. No recent change in weight. HEMATOLOGICAL: No anemia or easy bruising or bleeding. NEUROLOGIC: No headache, seizures, numbness, tingling or weakness. PSYCHIATRIC: No depression, no loss of interest in normal activity or change in sleep pattern.     Exam:   BP 102/68    Ht 5' 4.5" (1.638 m)   Wt 119 lb (54 kg)   LMP 08/14/2019   BMI 20.11 kg/m   Body mass index is 20.11 kg/m.  General appearance : Well developed well nourished female. No acute distress HEENT: Eyes: no retinal hemorrhage or exudates,  Neck supple, trachea midline, no carotid bruits, no thyroidmegaly Lungs: Clear to auscultation, no rhonchi or wheezes, or rib retractions  Heart: Regular rate and rhythm, no murmurs or gallops Breast:Examined in sitting and supine position were symmetrical in appearance, no palpable masses or tenderness,  no skin retraction, no nipple inversion, no nipple discharge, no skin discoloration, no axillary or supraclavicular lymphadenopathy Abdomen: no palpable masses or tenderness, no rebound or guarding Extremities: no edema or skin discoloration or tenderness  Pelvic: Vulva: Normal             Vagina: No gross lesions or discharge  Cervix: No gross lesions or discharge  Uterus  AV, normal size, shape and consistency, non-tender and mobile  Adnexa  Without masses or tenderness  Anus: Normal   Assessment/Plan:  49 y.o. female for annual exam   1. Well female exam with routine gynecological exam Normal gynecologic exam.  Pap test Negative/HPV HR Negative in 07/2018, no indication to repeat a Pap test this year.  Breast exam normal.  Screening mammogram January  2021 was negative.  Colonoscopy done in 2020.  Health labs in September 2020 were completely normal.  Good body mass index at 20.11.  Continue with fitness and healthy nutrition.  2. Relies on partner vasectomy for contraception  Genia Del MD, 10:48 AM 08/26/2019

## 2019-09-01 ENCOUNTER — Encounter: Payer: Self-pay | Admitting: Obstetrics & Gynecology

## 2019-09-01 NOTE — Patient Instructions (Signed)
1. Well female exam with routine gynecological exam Normal gynecologic exam.  Pap test Negative/HPV HR Negative in 07/2018, no indication to repeat a Pap test this year.  Breast exam normal.  Screening mammogram January 2021 was negative.  Colonoscopy done in 2020.  Health labs in September 2020 were completely normal.  Good body mass index at 20.11.  Continue with fitness and healthy nutrition.  2. Relies on partner vasectomy for contraception  Hulda, it was a pleasure seeing you today!

## 2020-08-29 ENCOUNTER — Ambulatory Visit (INDEPENDENT_AMBULATORY_CARE_PROVIDER_SITE_OTHER): Payer: 59 | Admitting: Obstetrics & Gynecology

## 2020-08-29 ENCOUNTER — Encounter: Payer: Self-pay | Admitting: Obstetrics & Gynecology

## 2020-08-29 ENCOUNTER — Other Ambulatory Visit: Payer: Self-pay

## 2020-08-29 VITALS — BP 108/68 | Ht 64.0 in | Wt 118.0 lb

## 2020-08-29 DIAGNOSIS — Z01419 Encounter for gynecological examination (general) (routine) without abnormal findings: Secondary | ICD-10-CM

## 2020-08-29 DIAGNOSIS — Z9189 Other specified personal risk factors, not elsewhere classified: Secondary | ICD-10-CM

## 2020-08-29 NOTE — Progress Notes (Signed)
Lisa Frederick Aug 15, 1970 [id number]   History:    50 y.o. G3P3L3 Married. Vasectomy. Children 14-16-18 yo  ES:[id number] presenting for annual gyn exam   POL:IDCVUD regular normal every month. No BTB. No pelvic pain. Normal vaginal secretions. No pain with intercourse. Vasectomy.Urine and bowel movements normal. Breasts normal. Body mass index good at 20.25. Enjoys Scientist, clinical (histocompatibility and immunogenetics) and working out with the [employer].Fasting health labs here in 01/2019 were completely normal, will repeat here this year.  Past medical history,surgical history, family history and social history were all reviewed and documented in the EPIC chart.  Gynecologic History Patient's last menstrual period was 08/22/2020.  Obstetric History OB History  Gravida Para Term Preterm AB Living  '3 3       3  ' SAB IAB Ectopic Multiple Live Births               # Outcome Date GA Lbr Len/2nd Weight Sex Delivery Anes PTL Lv  3 Para           2 Para           1 Para              ROS: A ROS was performed and pertinent positives and negatives are included in the history.  GENERAL: No fevers or chills. HEENT: No change in vision, no earache, sore throat or sinus congestion. NECK: No pain or stiffness. CARDIOVASCULAR: No chest pain or pressure. No palpitations. PULMONARY: No shortness of breath, cough or wheeze. GASTROINTESTINAL: No abdominal pain, nausea, vomiting or diarrhea, melena or bright red blood per rectum. GENITOURINARY: No urinary frequency, urgency, hesitancy or dysuria. MUSCULOSKELETAL: No joint or muscle pain, no back pain, no recent trauma. DERMATOLOGIC: No rash, no itching, no lesions. ENDOCRINE: No polyuria, polydipsia, no heat or cold intolerance. No recent change in weight. HEMATOLOGICAL: No anemia or easy bruising or bleeding. NEUROLOGIC: No headache, seizures, numbness, tingling or weakness. PSYCHIATRIC: No depression, no loss of interest in normal activity or change in sleep pattern.      Exam:   BP 108/68   Ht '5\' 4"'  (1.626 m)   Wt 118 lb (53.5 kg)   LMP 08/22/2020   BMI 20.25 kg/m   Body mass index is 20.25 kg/m.  General appearance : Well developed well nourished female. No acute distress HEENT: Eyes: no retinal hemorrhage or exudates,  Neck supple, trachea midline, no carotid bruits, no thyroidmegaly Lungs: Clear to auscultation, no rhonchi or wheezes, or rib retractions  Heart: Regular rate and rhythm, no murmurs or gallops Breast:Examined in sitting and supine position were symmetrical in appearance, no palpable masses or tenderness,  no skin retraction, no nipple inversion, no nipple discharge, no skin discoloration, no axillary or supraclavicular lymphadenopathy Abdomen: no palpable masses or tenderness, no rebound or guarding Extremities: no edema or skin discoloration or tenderness  Pelvic: Vulva: Normal             Vagina: No gross lesions or discharge  Cervix: No gross lesions or discharge  Uterus  AV, normal size, shape and consistency, non-tender and mobile  Adnexa  Without masses or tenderness  Anus: Normal   Assessment/Plan:  50 y.o. female for annual exam   1. Well female exam with routine gynecological exam Normal gynecologic exam.  Pap test in 2020 was negative, no indication to repeat a Pap test this year.  Breast exam normal.  Screening mammogram scheduled this month.  Colonoscopy 2020.  Body mass index 20.25.  Continue with fitness and  healthy nutrition.  We will follow-up here for fasting health labs. - CBC; Future - Comp Met (CMET); Future - Lipid Profile; Future - TSH; Future - Vitamin D 1,25 dihydroxy; Future  2. Relies on partner vasectomy for contraception  Other orders - cyanocobalamin 100 MCG tablet; Take 100 mcg by mouth daily. - Multiple Vitamin (MULTIVITAMIN) tablet; Take 1 tablet by mouth daily. - ferrous sulfate 325 (65 FE) MG EC tablet; Take 325 mg by mouth 3 (three) times daily with meals.  Princess Bruins MD,  11:59 AM 08/29/2020

## 2020-09-13 ENCOUNTER — Other Ambulatory Visit: Payer: 59

## 2020-09-13 ENCOUNTER — Other Ambulatory Visit: Payer: Self-pay | Admitting: Obstetrics & Gynecology

## 2020-09-13 ENCOUNTER — Other Ambulatory Visit: Payer: Self-pay

## 2020-09-13 DIAGNOSIS — Z01419 Encounter for gynecological examination (general) (routine) without abnormal findings: Secondary | ICD-10-CM

## 2020-09-13 DIAGNOSIS — Z1231 Encounter for screening mammogram for malignant neoplasm of breast: Secondary | ICD-10-CM

## 2020-09-14 ENCOUNTER — Ambulatory Visit
Admission: RE | Admit: 2020-09-14 | Discharge: 2020-09-14 | Disposition: A | Discharge status: Home / Self Care | Payer: 59 | Source: Ambulatory Visit | Attending: Obstetrics & Gynecology | Admitting: Obstetrics & Gynecology

## 2020-09-14 DIAGNOSIS — Z1231 Encounter for screening mammogram for malignant neoplasm of breast: Secondary | ICD-10-CM

## 2020-09-15 ENCOUNTER — Encounter: Payer: Self-pay | Admitting: *Deleted

## 2020-09-16 LAB — COMPREHENSIVE METABOLIC PANEL
AG Ratio: 1.8 (calc) (ref 1.0–2.5)
ALT: 18 U/L (ref 6–29)
AST: 27 U/L (ref 10–35)
Albumin: 4.8 g/dL (ref 3.6–5.1)
Alkaline phosphatase (APISO): 58 U/L (ref 37–153)
BUN: 15 mg/dL (ref 7–25)
CO2: 27 mmol/L (ref 20–32)
Calcium: 9.3 mg/dL (ref 8.6–10.4)
Chloride: 102 mmol/L (ref 98–110)
Creat: 0.77 mg/dL (ref 0.50–1.05)
Globulin: 2.6 g/dL (calc) (ref 1.9–3.7)
Glucose, Bld: 84 mg/dL (ref 65–99)
Potassium: 4.1 mmol/L (ref 3.5–5.3)
Sodium: 139 mmol/L (ref 135–146)
Total Bilirubin: 0.4 mg/dL (ref 0.2–1.2)
Total Protein: 7.4 g/dL (ref 6.1–8.1)

## 2020-09-16 LAB — LIPID PANEL
Cholesterol: 169 mg/dL (ref <200)
HDL: 73 mg/dL (ref >=50)
LDL Cholesterol (Calc): 80 mg/dL (calc)
Non-HDL Cholesterol (Calc): 96 mg/dL (calc) (ref <130)
Total CHOL/HDL Ratio: 2.3 (calc) (ref <5.0)
Triglycerides: 78 mg/dL (ref <150)

## 2020-09-16 LAB — CBC
HCT: 41.2 % (ref 35.0–45.0)
Hemoglobin: 13.9 g/dL (ref 11.7–15.5)
MCH: 34.6 pg — ABNORMAL HIGH (ref 27.0–33.0)
MCHC: 33.7 g/dL (ref 32.0–36.0)
MCV: 102.5 fL — ABNORMAL HIGH (ref 80.0–100.0)
MPV: 10.1 fL (ref 7.5–12.5)
Platelets: 214 10*3/uL (ref 140–400)
RBC: 4.02 10*6/uL (ref 3.80–5.10)
RDW: 11.8 % (ref 11.0–15.0)
WBC: 3.1 10*3/uL — ABNORMAL LOW (ref 3.8–10.8)

## 2020-09-16 LAB — VITAMIN D 1,25 DIHYDROXY
Vitamin D 1, 25 (OH)2 Total: 24 pg/mL (ref 18–72)
Vitamin D2 1, 25 (OH)2: <8 pg/mL
Vitamin D3 1, 25 (OH)2: 24 pg/mL

## 2020-09-16 LAB — TSH: TSH: 1.82 mIU/L

## 2021-06-26 IMAGING — MG DIGITAL SCREENING BILAT W/ CAD
4 series · 4 of 4 positions shown · non-contrast
Comparison: Previous exam(s).

CLINICAL DATA: Screening.

EXAM:
DIGITAL SCREENING BILATERAL MAMMOGRAM WITH CAD

[R CC]
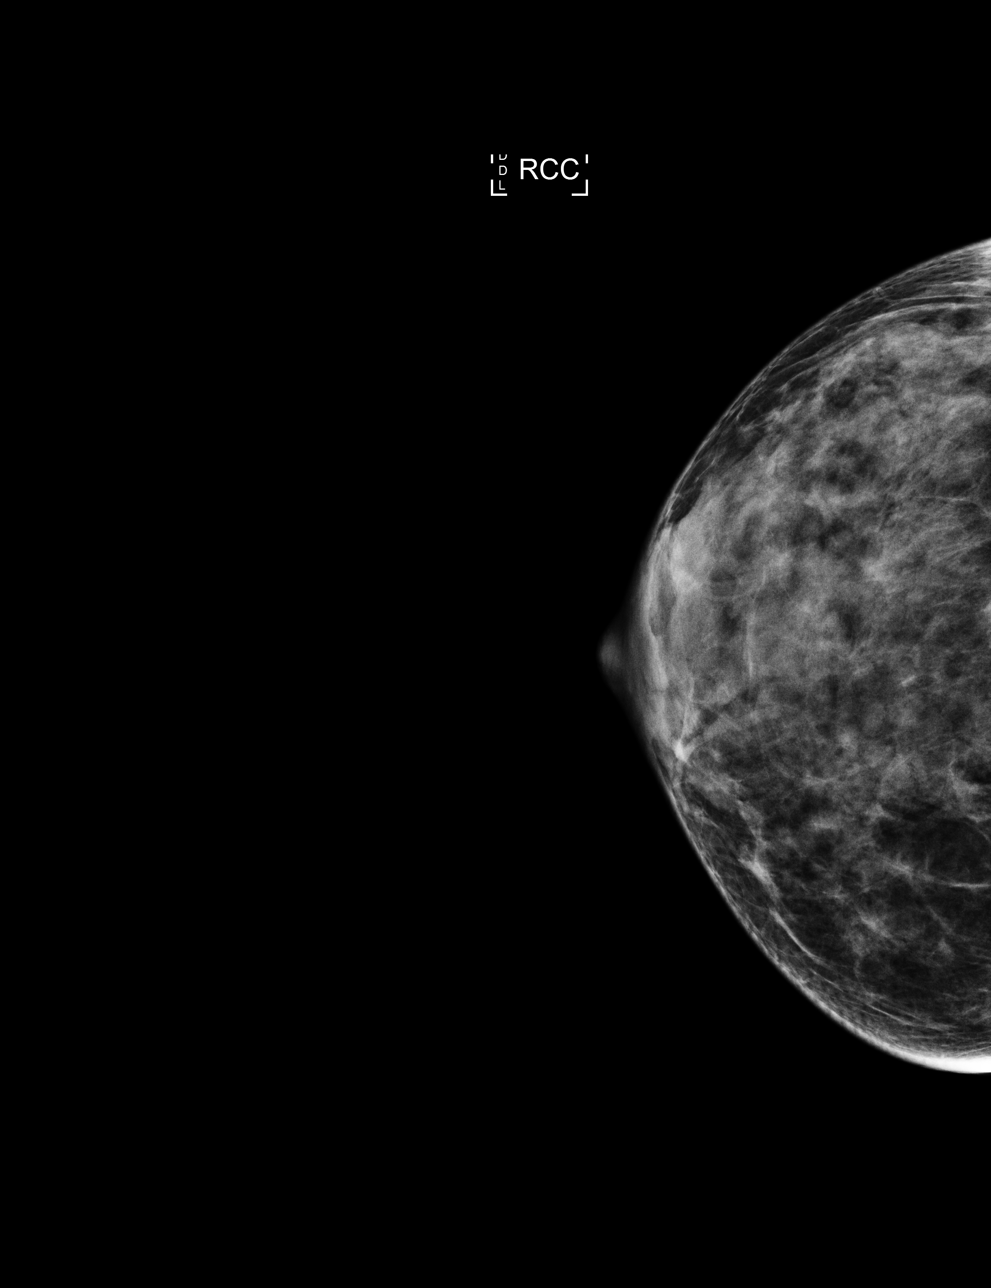

[L CC]
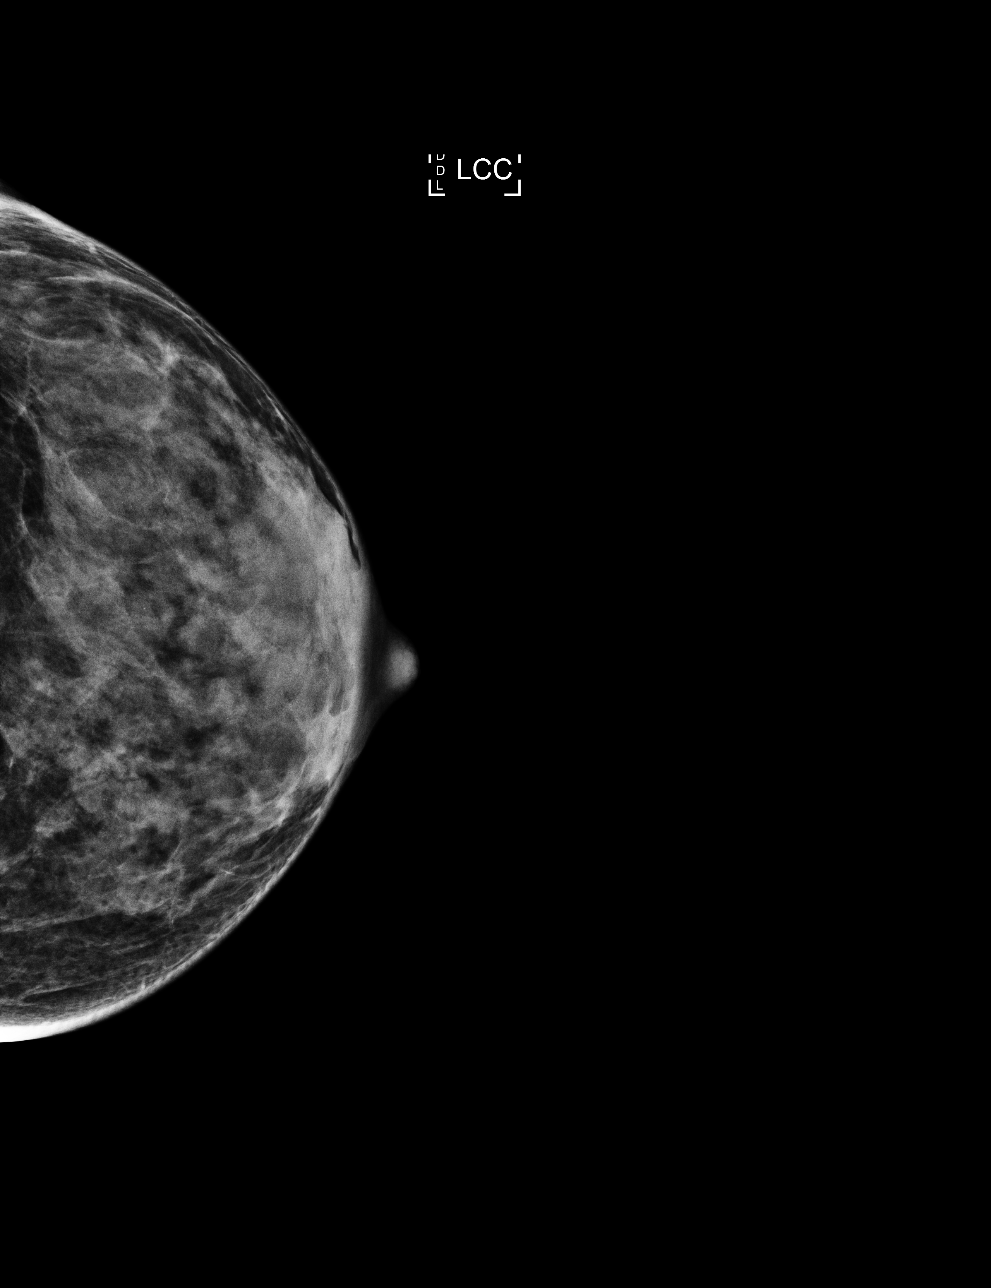

[L MLO]
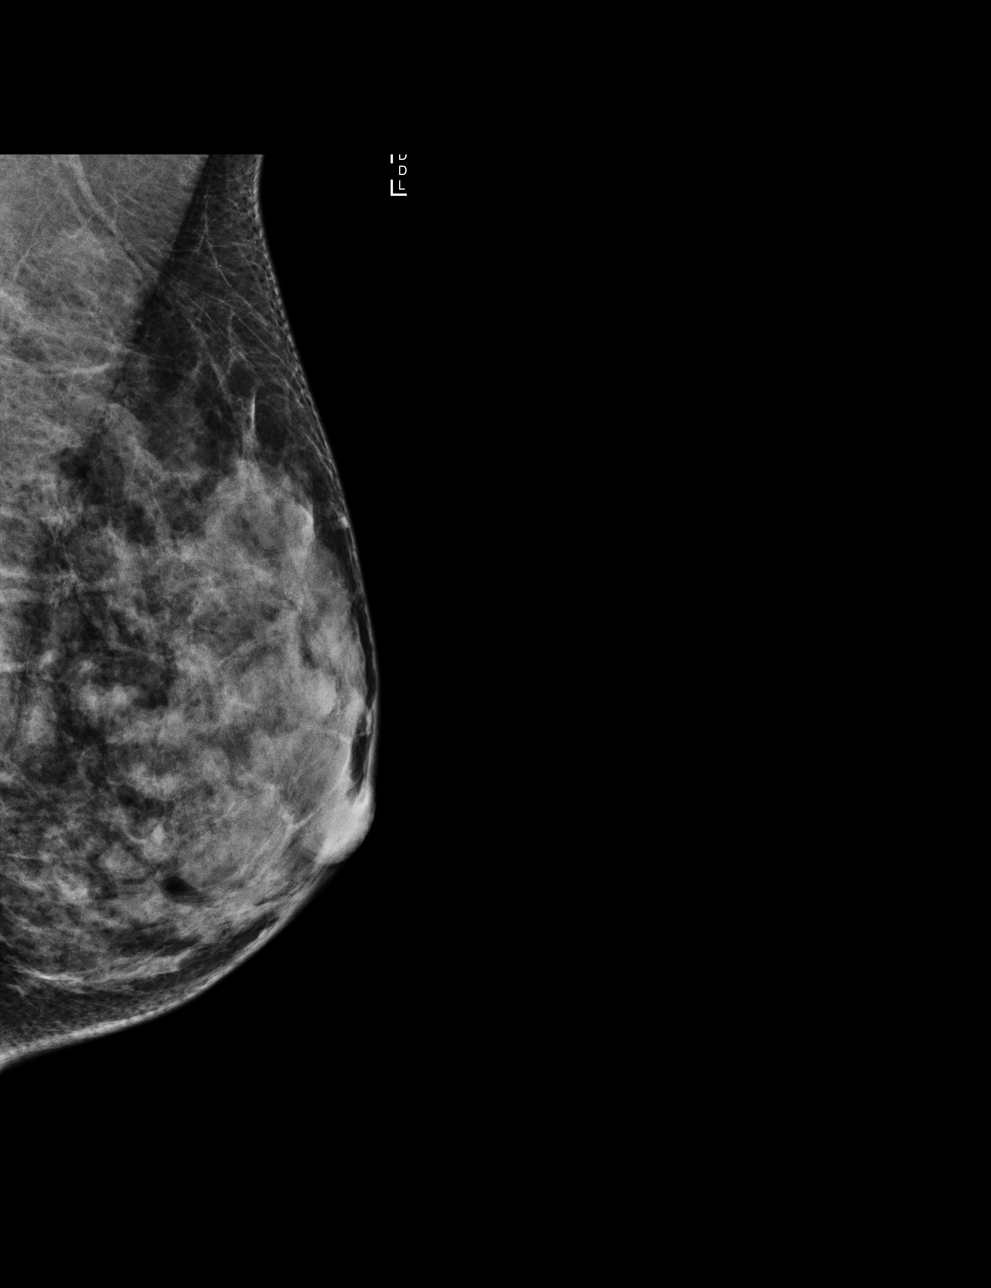

[R MLO]
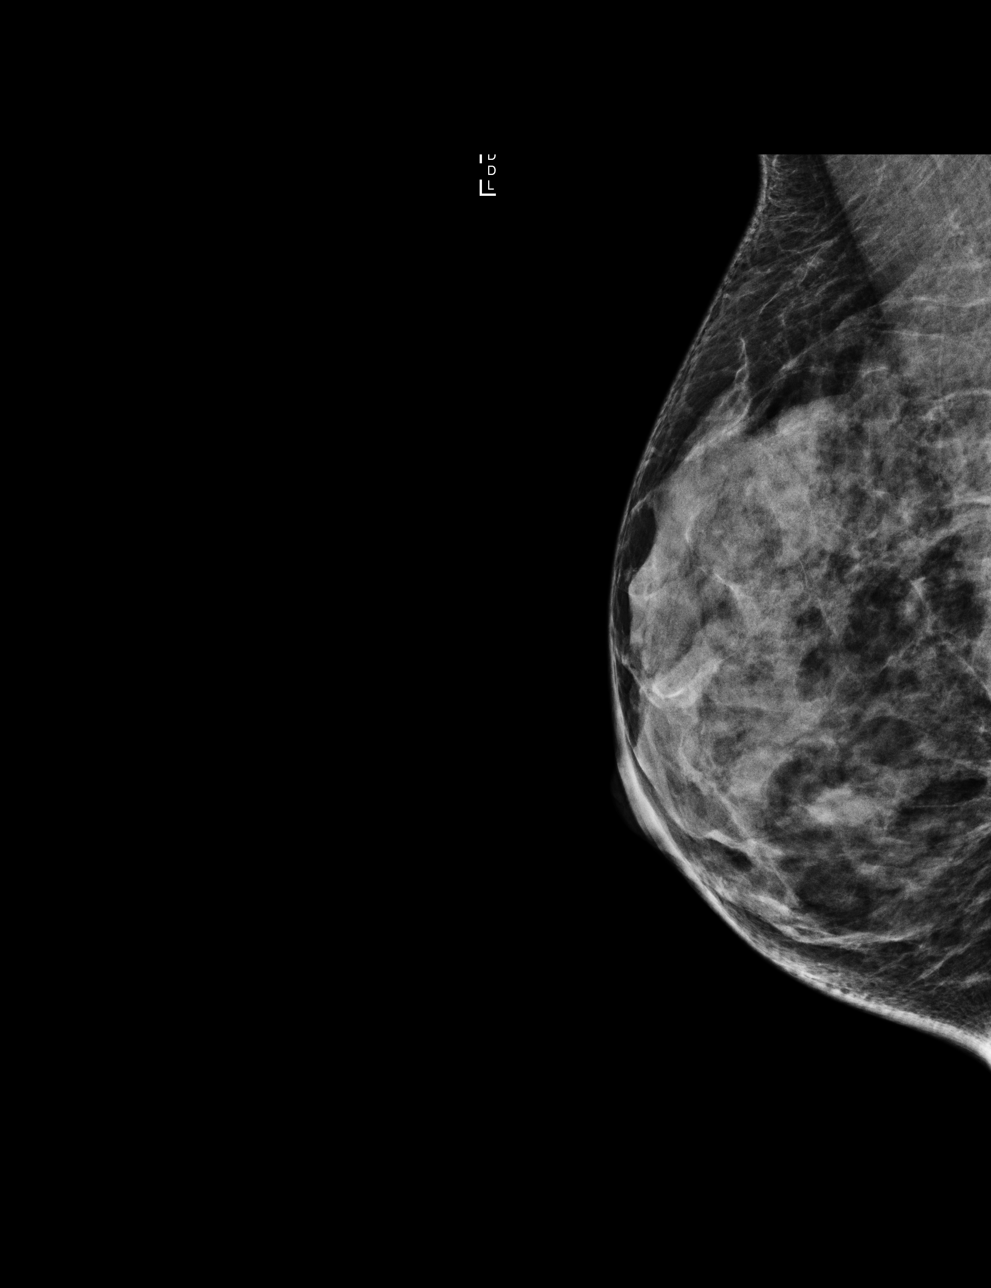

[4 of 4 positions shown; findings below may reference images not displayed]

ACR Breast Density Category d: The breast tissue is extremely dense,
which lowers the sensitivity of mammography.
FINDINGS: In the right breast, a possible asymmetry warrants further
evaluation. In the left breast, no findings suspicious for
malignancy. Images were processed with CAD.
IMPRESSION: Further evaluation is suggested for possible asymmetry in the right
breast.

RECOMMENDATION:
Diagnostic mammogram and possibly ultrasound of the right breast.
(Code:[id number])

The patient will be contacted regarding the findings, and additional
imaging will be scheduled.

BI-RADS CATEGORY  0: Incomplete. Need additional imaging evaluation
and/or prior mammograms for comparison.

## 2021-08-02 ENCOUNTER — Other Ambulatory Visit: Payer: Self-pay | Admitting: Obstetrics & Gynecology

## 2021-09-17 ENCOUNTER — Ambulatory Visit
Admission: RE | Admit: 2021-09-17 | Discharge: 2021-09-17 | Disposition: A | Discharge status: Home / Self Care | Payer: 59 | Source: Ambulatory Visit | Attending: Obstetrics & Gynecology | Admitting: Obstetrics & Gynecology

## 2021-09-17 DIAGNOSIS — Z1231 Encounter for screening mammogram for malignant neoplasm of breast: Secondary | ICD-10-CM

## 2021-09-18 ENCOUNTER — Other Ambulatory Visit: Payer: Self-pay | Admitting: Obstetrics & Gynecology

## 2021-09-18 DIAGNOSIS — R928 Other abnormal and inconclusive findings on diagnostic imaging of breast: Secondary | ICD-10-CM

## 2021-09-26 ENCOUNTER — Ambulatory Visit
Admission: RE | Admit: 2021-09-26 | Discharge: 2021-09-26 | Disposition: A | Discharge status: Home / Self Care | Payer: 59 | Source: Ambulatory Visit | Attending: Obstetrics & Gynecology | Admitting: Obstetrics & Gynecology

## 2021-09-26 ENCOUNTER — Other Ambulatory Visit: Payer: Self-pay | Admitting: Obstetrics & Gynecology

## 2021-09-26 DIAGNOSIS — R928 Other abnormal and inconclusive findings on diagnostic imaging of breast: Secondary | ICD-10-CM

## 2021-09-26 DIAGNOSIS — N6489 Other specified disorders of breast: Secondary | ICD-10-CM

## 2021-10-02 ENCOUNTER — Other Ambulatory Visit (HOSPITAL_COMMUNITY)
Admission: RE | Admit: 2021-10-02 | Discharge: 2021-10-02 | Disposition: A | Discharge status: Home / Self Care | Payer: 59 | Source: Ambulatory Visit | Attending: Obstetrics & Gynecology | Admitting: Obstetrics & Gynecology

## 2021-10-02 ENCOUNTER — Encounter: Payer: Self-pay | Admitting: Obstetrics & Gynecology

## 2021-10-02 ENCOUNTER — Ambulatory Visit (INDEPENDENT_AMBULATORY_CARE_PROVIDER_SITE_OTHER): Payer: 59 | Admitting: Obstetrics & Gynecology

## 2021-10-02 VITALS — BP 112/70 | HR 71 | Resp 16 | Ht 63.75 in | Wt 121.0 lb

## 2021-10-02 DIAGNOSIS — Z9189 Other specified personal risk factors, not elsewhere classified: Secondary | ICD-10-CM

## 2021-10-02 DIAGNOSIS — N951 Menopausal and female climacteric states: Secondary | ICD-10-CM | POA: Diagnosis not present

## 2021-10-02 DIAGNOSIS — Z01419 Encounter for gynecological examination (general) (routine) without abnormal findings: Secondary | ICD-10-CM | POA: Insufficient documentation

## 2021-10-02 NOTE — Progress Notes (Signed)
? ? ?Charmin Barriga Jan 09, 1971 [id number] ? ? ?History:    51 y.o. G3P3L3 Married.  Vasectomy.  Children 15-17 and almost yo daughter who is my patient. ?  ?RP: Established patient presenting for annual gyn exam  ?  ?HPI:  Menses regular normal every month, except for 2 occasions when she skipped a month.  The flow was heavier on the subsequent menses.   No BTB.  C/O some anxiety around the times of spaced menses.  No pelvic pain.  Normal vaginal secretions. No pain with intercourse. Pap Neg 07/2018.  No h/o abnormal Pap.  Pap reflex done.  Vasectomy.  Urine and bowel movements normal. Breasts normal.  Mammo 09/2021.  Left Dx Clotilde Dieter Clarene [place] Probably benign, repeat at 6 months.  Body mass index good at 20.93.  Enjoys Scientist, clinical (histocompatibility and immunogenetics) and working out with the [employer].  Fasting health labs here in 01/2019 were completely normal, will repeat here this year.  BMI 20.93.  Good fitness. COLONOSCOPY: 04-21-19. ? ?Past medical history,surgical history, family history and social history were all reviewed and documented in the EPIC chart. ? ?Gynecologic History ?Patient's last menstrual period was 09/20/2021 (exact date). ? ?Obstetric History ?OB History  ?Gravida Para Term Preterm AB Living  ?3 3       3   ?SAB IAB Ectopic Multiple Live Births  ?           ?  ?# Outcome Date GA Lbr Len/2nd Weight Sex Delivery Anes PTL Lv  ?3 Para           ?2 Para           ?1 Para           ? ? ? ?ROS: A ROS was performed and pertinent positives and negatives are included in the history. ?GENERAL: No fevers or chills. HEENT: No change in vision, no earache, sore throat or sinus congestion. NECK: No pain or stiffness. CARDIOVASCULAR: No chest pain or pressure. No palpitations. PULMONARY: No shortness of breath, cough or wheeze. GASTROINTESTINAL: No abdominal pain, nausea, vomiting or diarrhea, melena or bright red blood per rectum. GENITOURINARY: No urinary frequency, urgency, hesitancy or dysuria. MUSCULOSKELETAL: No joint or muscle pain, no back pain, no  recent trauma. DERMATOLOGIC: No rash, no itching, no lesions. ENDOCRINE: No polyuria, polydipsia, no heat or cold intolerance. No recent change in weight. HEMATOLOGICAL: No anemia or easy bruising or bleeding. NEUROLOGIC: No headache, seizures, numbness, tingling or weakness. PSYCHIATRIC: No depression, no loss of interest in normal activity or change in sleep pattern.  ?  ? ?Exam: ? ? ?BP 112/70   Pulse 71   Resp 16   Ht 5' 3.75" (1.619 m)   Wt 121 lb (54.9 kg)   LMP 09/20/2021 (Exact Date)   BMI 20.93 kg/m?  ? ?Body mass index is 20.93 kg/m?. ? ?General appearance : Well developed well nourished female. No acute distress ?HEENT: Eyes: no retinal hemorrhage or exudates,  Neck supple, trachea midline, no carotid bruits, no thyroidmegaly ?Lungs: Clear to auscultation, no rhonchi or wheezes, or rib retractions  ?Heart: Regular rate and rhythm, no murmurs or gallops ?Breast:Examined in sitting and supine position were symmetrical in appearance, no palpable masses or tenderness,  no skin retraction, no nipple inversion, no nipple discharge, no skin discoloration, no axillary or supraclavicular lymphadenopathy ?Abdomen: no palpable masses or tenderness, no rebound or guarding ?Extremities: no edema or skin discoloration or tenderness ? ?Pelvic: Vulva: Normal ?  Vagina: No gross lesions or discharge ? Cervix: No gross lesions or discharge.  Pap Reflex done.   ? Uterus AV, normal size, shape and consistency, non-tender and mobile ? Adnexa  Without masses or tenderness ? Anus: Normal ? ? ?Assessment/Plan:  51 y.o. female for annual exam  ? ?1. Encounter for routine gynecological examination with Papanicolaou smear of cervix ?Menses regular normal every month, except for 2 occasions when she skipped a month.  The flow was heavier on the subsequent menses.   No BTB.  C/O some anxiety around the times of spaced menses.  No pelvic pain.  Normal vaginal secretions. No pain with intercourse. Pap Neg 07/2018.  No  h/o abnormal Pap.  Pap reflex done.  Vasectomy.  Urine and bowel movements normal. Breasts normal.  Mammo 09/2021.  Left Dx Clotilde Dieter Clarene [place] Probably benign, repeat at 6 months.  Body mass index good at 20.93.  Enjoys Scientist, clinical (histocompatibility and immunogenetics) and working out with the [employer].  Fasting health labs here in 01/2019 were completely normal, will repeat here this year.  BMI 20.93.  Good fitness. COLONOSCOPY: 04-21-19. ?- Cytology - PAP( [place]) ? ?2. Relies on partner vasectomy for contraception ? ?3. Perimenopause ?Menses regular normal every month, except for 2 occasions when she skipped a month.  The flow was heavier on the subsequent menses.   No BTB.  C/O some anxiety around the times of spaced menses.  No pelvic pain.  Normal vaginal secretions. No pain with intercourse.  Counseling on perimenopause and menopause.  Menopausal Sxs and Abnormal bleeding precautions reviewed.  May use Ashwagandha as needed as progresses into menopause. ? ?Other orders ?- COLLAGEN PO; Take by mouth. ?- Turmeric (QC TUMERIC COMPLEX PO); Take by mouth. ?- Probiotic Product (PROBIOTIC PO); Take by mouth. Pre/probiotic  ? ?Princess Bruins MD, 10:11 AM 10/02/2021 ? ?  ?

## 2021-10-03 LAB — CYTOLOGY - PAP: Diagnosis: NEGATIVE

## 2022-04-01 ENCOUNTER — Other Ambulatory Visit: Payer: Self-pay | Admitting: Obstetrics & Gynecology

## 2022-04-01 ENCOUNTER — Ambulatory Visit
Admission: RE | Admit: 2022-04-01 | Discharge: 2022-04-01 | Disposition: A | Discharge status: Home / Self Care | Payer: 59 | Source: Ambulatory Visit | Attending: Obstetrics & Gynecology | Admitting: Obstetrics & Gynecology

## 2022-04-01 DIAGNOSIS — N6489 Other specified disorders of breast: Secondary | ICD-10-CM

## 2022-04-01 DIAGNOSIS — N6320 Unspecified lump in the left breast, unspecified quadrant: Secondary | ICD-10-CM

## 2022-10-02 ENCOUNTER — Ambulatory Visit
Admission: RE | Admit: 2022-10-02 | Discharge: 2022-10-02 | Disposition: A | Discharge status: Home / Self Care | Payer: BC Managed Care – PPO | Source: Ambulatory Visit | Attending: Obstetrics & Gynecology | Admitting: Obstetrics & Gynecology

## 2022-10-02 ENCOUNTER — Ambulatory Visit
Admission: RE | Admit: 2022-10-02 | Discharge: 2022-10-02 | Disposition: A | Discharge status: Home / Self Care | Payer: 59 | Source: Ambulatory Visit | Attending: Obstetrics & Gynecology | Admitting: Obstetrics & Gynecology

## 2022-10-02 DIAGNOSIS — N6320 Unspecified lump in the left breast, unspecified quadrant: Secondary | ICD-10-CM

## 2022-10-02 DIAGNOSIS — N6489 Other specified disorders of breast: Secondary | ICD-10-CM

## 2022-10-02 DIAGNOSIS — N63 Unspecified lump in unspecified breast: Secondary | ICD-10-CM | POA: Diagnosis not present

## 2022-10-23 ENCOUNTER — Encounter: Payer: Self-pay | Admitting: Obstetrics & Gynecology

## 2022-10-23 ENCOUNTER — Ambulatory Visit (INDEPENDENT_AMBULATORY_CARE_PROVIDER_SITE_OTHER): Payer: BC Managed Care – PPO | Admitting: Obstetrics & Gynecology

## 2022-10-23 VITALS — BP 108/64 | HR 69 | Ht 64.25 in | Wt 114.0 lb

## 2022-10-23 DIAGNOSIS — Z9189 Other specified personal risk factors, not elsewhere classified: Secondary | ICD-10-CM

## 2022-10-23 DIAGNOSIS — N951 Menopausal and female climacteric states: Secondary | ICD-10-CM | POA: Diagnosis not present

## 2022-10-23 DIAGNOSIS — Z01419 Encounter for gynecological examination (general) (routine) without abnormal findings: Secondary | ICD-10-CM

## 2022-10-23 NOTE — Progress Notes (Signed)
Lisa Frederick January 23, 1971 [id number]   History:    52 y.o.  G3P3L3 Married.  Vasectomy.     RP: Established patient presenting for annual gyn exam    HPI:  Perimenopause with LMP 05/31/22.  Feels better in the last 5 months, without periods. Very mild and rare hot flushes.  No pelvic pain.  Normal vaginal secretions. Some dryness with intercourse, recommend Replens. Pap Neg 09/2021.  No h/o abnormal Pap.  Repeat Pap at 3 years. Vasectomy.  Breasts normal.  Bilateral Dx Mammo 10/02/22 Rt Negative, Lt with breast u/s Probably Benign. Urine and bowel movements normal. Enjoys [employer] and working out with the [employer]. Good fitness. BMI 19.42. COLONOSCOPY: 04-21-19.  Past medical history,surgical history, family history and social history were all reviewed and documented in the EPIC chart.  Gynecologic History Patient's last menstrual period was 05/31/2022 (exact date).  Obstetric History OB History  Gravida Para Term Preterm AB Living  3 3       3   SAB IAB Ectopic Multiple Live Births               # Outcome Date GA Lbr Len/2nd Weight Sex Delivery Anes PTL Lv  3 Para           2 Para           1 Para             ROS: A ROS was performed and pertinent positives and negatives are included in the history. GENERAL: No fevers or chills. HEENT: No change in vision, no earache, sore throat or sinus congestion. NECK: No pain or stiffness. CARDIOVASCULAR: No chest pain or pressure. No palpitations. PULMONARY: No shortness of breath, cough or wheeze. GASTROINTESTINAL: No abdominal pain, nausea, vomiting or diarrhea, melena or bright red blood per rectum. GENITOURINARY: No urinary frequency, urgency, hesitancy or dysuria. MUSCULOSKELETAL: No joint or muscle pain, no back pain, no recent trauma. DERMATOLOGIC: No rash, no itching, no lesions. ENDOCRINE: No polyuria, polydipsia, no heat or cold intolerance. No recent change in weight. HEMATOLOGICAL: No anemia or easy bruising or bleeding. NEUROLOGIC: No  headache, seizures, numbness, tingling or weakness. PSYCHIATRIC: No depression, no loss of interest in normal activity or change in sleep pattern.     Exam:   BP 108/64   Pulse 69   Ht 5' 4.25" (1.632 m)   Wt 114 lb (51.7 kg)   LMP 05/31/2022 (Exact Date) Comment: sexually active, vasectomy  SpO2 98%   BMI 19.42 kg/m   Body mass index is 19.42 kg/m.  General appearance : Well developed well nourished female. No acute distress HEENT: Eyes: no retinal hemorrhage or exudates,  Neck supple, trachea midline, no carotid bruits, no thyroidmegaly Lungs: Clear to auscultation, no rhonchi or wheezes, or rib retractions  Heart: Regular rate and rhythm, no murmurs or gallops Breast:Examined in sitting and supine position were symmetrical in appearance, no palpable masses or tenderness,  no skin retraction, no nipple inversion, no nipple discharge, no skin discoloration, no axillary or supraclavicular lymphadenopathy Abdomen: no palpable masses or tenderness, no rebound or guarding Extremities: no edema or skin discoloration or tenderness  Pelvic: Vulva: Normal             Vagina: No gross lesions or discharge  Cervix: No gross lesions or discharge  Uterus  AV, normal size, shape and consistency, non-tender and mobile  Adnexa  Without masses or tenderness  Anus: Normal   Assessment/Plan:  52 y.o. female for annual  exam   1. Well female exam with routine gynecological exam Perimenopause with LMP 05/31/22.  Feels better in the last 5 months, without periods. Very mild and rare hot flushes.  No pelvic pain.  Normal vaginal secretions. Some dryness with intercourse, recommend Replens. Pap Neg 09/2021.  No h/o abnormal Pap.  Repeat Pap at 3 years. Vasectomy.  Breasts normal.  Bilateral Dx Mammo 10/02/22 Rt Negative, Lt with breast u/s Probably Benign. Urine and bowel movements normal. Enjoys [employer] and working out with the [employer]. Good fitness. BMI 19.42. COLONOSCOPY: 04-21-19.  2. Relies on  partner vasectomy for contraception  3. Perimenopause Perimenopause with LMP 05/31/22.  Feels better in the last 5 months, without periods. Very mild and rare hot flushes.  No pelvic pain.  Normal vaginal secretions. Some dryness with intercourse, recommend Replens.  Bleeding precautions in perimenopause reviewed.  Eventual HRT Benefits/Risks/Usage reviewed.  Will observe at this time.  Other orders - tretinoin (RETIN-A) 0.05 % cream; Apply topically at bedtime.   Genia Del MD, 3:40 PM

## 2023-06-05 DIAGNOSIS — M50223 Other cervical disc displacement at C6-C7 level: Secondary | ICD-10-CM | POA: Diagnosis not present

## 2023-06-11 ENCOUNTER — Ambulatory Visit (INDEPENDENT_AMBULATORY_CARE_PROVIDER_SITE_OTHER): Payer: BC Managed Care – PPO | Admitting: Radiology

## 2023-06-11 ENCOUNTER — Encounter: Payer: Self-pay | Admitting: Radiology

## 2023-06-11 ENCOUNTER — Other Ambulatory Visit: Payer: Self-pay

## 2023-06-11 ENCOUNTER — Other Ambulatory Visit (HOSPITAL_COMMUNITY): Payer: Self-pay

## 2023-06-11 VITALS — BP 102/58 | HR 81

## 2023-06-11 DIAGNOSIS — N951 Menopausal and female climacteric states: Secondary | ICD-10-CM | POA: Diagnosis not present

## 2023-06-11 MED ORDER — PROGESTERONE MICRONIZED 100 MG PO CAPS
100.0000 mg | ORAL_CAPSULE | Freq: Every day | ORAL | 0 refills | Status: DC
  Filled 2023-06-11: qty 90, 90d supply, fill #0
  Filled 2023-06-11: qty 81, 81d supply, fill #0
  Filled 2023-06-11: qty 9, 9d supply, fill #0

## 2023-06-11 MED ORDER — ESTRADIOL 0.0375 MG/24HR TD PTTW
1.0000 | MEDICATED_PATCH | TRANSDERMAL | 0 refills | Status: DC
  Filled 2023-06-11 – 2023-06-12 (×4): qty 24, 84d supply, fill #0

## 2023-06-11 NOTE — Progress Notes (Signed)
   Lisa Frederick Jun 25, 1970 [id number]   History:  53 y.o. G3P3 presents to discuss menopause symptoms and treatment options. LMP: 12/2022. Complains of trouble sleeping, joint pains, frozen shoulder, hot flashes, night sweats, anxiety. Interested in HRT.  Gynecologic History Patient's last menstrual period was 12/19/2022.   Contraception/Family planning: vasectomy Sexually active: yes Last Pap: 2023. Results were: normal Last mammogram: 10/02/22. Results were: normal  Obstetric History OB History  Gravida Para Term Preterm AB Living  3 3    3   SAB IAB Ectopic Multiple Live Births          # Outcome Date GA Lbr Len/2nd Weight Sex Type Anes PTL Lv  3 Para           2 Para           1 Para             The following portions of the patient's history were reviewed and updated as appropriate: allergies, current medications, past family history, past medical history, past social history, past surgical history, and problem list.  Review of Systems  All other systems reviewed and are negative.   Past medical history, past surgical history, family history and social history were all reviewed and documented in the EPIC chart.  Exam:  Vitals:   06/11/23 1355  BP: (!) 102/58  Pulse: 81  SpO2: 96%   There is no height or weight on file to calculate BMI.  Physical Exam Vitals and nursing note reviewed.  Constitutional:      Appearance: Normal appearance. She is normal weight.  Pulmonary:     Effort: Pulmonary effort is normal.  Neurological:     Mental Status: She is alert.  Psychiatric:        Mood and Affect: Mood normal.        Thought Content: Thought content normal.        Judgment: Judgment normal.      Raynelle Fanning, CMA present for exam  Assessment/Plan:   1. Menopausal symptoms (Primary) Discussed risks and benefits, would like to start HRT Aware she may have some spotting at first or a light cycle Warning signs reviewed Follow up in 3 months - estradiol  (VIVELLE-DOT) 0.0375 MG/24HR; Place 1 patch onto the skin 2 (two) times a week.  Dispense: 24 patch; Refill: 0 - progesterone (PROMETRIUM) 100 MG capsule; Take 1 capsule (100 mg total) by mouth daily.  Dispense: 90 capsule; Refill: 0    Return in about 3 months (around 09/09/2023) for Med Follow-up.  Arlie Solomons B WHNP-BC 2:14 PM 06/11/2023

## 2023-06-12 ENCOUNTER — Other Ambulatory Visit: Payer: Self-pay

## 2023-06-12 ENCOUNTER — Other Ambulatory Visit (HOSPITAL_COMMUNITY): Payer: Self-pay

## 2023-08-19 ENCOUNTER — Other Ambulatory Visit: Payer: Self-pay | Admitting: Radiology

## 2023-08-19 DIAGNOSIS — N6489 Other specified disorders of breast: Secondary | ICD-10-CM

## 2023-09-03 ENCOUNTER — Other Ambulatory Visit: Payer: Self-pay | Admitting: Radiology

## 2023-09-03 DIAGNOSIS — N951 Menopausal and female climacteric states: Secondary | ICD-10-CM

## 2023-09-04 NOTE — Telephone Encounter (Signed)
 Med refill request: estradiol 0.0375 patch and progesterone 100 mg Last OV: 06/11/23 HRT consult Last AEX: 10/23/22 Next AEX: none scheduled Next OV: follow up 09/10/23 Last MMG (if hormonal med) 10/02/23 Refill authorized: estradiol 0.0375 mg and progesterone 100 mg, needs to schedule AEX Please approve or deny as appropriate.

## 2023-09-09 ENCOUNTER — Other Ambulatory Visit (HOSPITAL_COMMUNITY): Payer: Self-pay

## 2023-09-09 MED ORDER — PROGESTERONE MICRONIZED 100 MG PO CAPS
100.0000 mg | ORAL_CAPSULE | Freq: Every day | ORAL | 0 refills | Status: DC
  Filled 2023-09-09: qty 90, 90d supply, fill #0

## 2023-09-09 MED ORDER — ESTRADIOL 0.0375 MG/24HR TD PTTW
1.0000 | MEDICATED_PATCH | TRANSDERMAL | 0 refills | Status: DC
  Filled 2023-09-09: qty 24, 84d supply, fill #0

## 2023-09-10 ENCOUNTER — Ambulatory Visit: Payer: BC Managed Care – PPO | Admitting: Radiology

## 2023-09-10 ENCOUNTER — Other Ambulatory Visit (HOSPITAL_COMMUNITY): Payer: Self-pay

## 2023-09-10 VITALS — BP 110/58 | HR 76 | Wt 118.2 lb

## 2023-09-10 DIAGNOSIS — N951 Menopausal and female climacteric states: Secondary | ICD-10-CM

## 2023-09-10 MED ORDER — PROGESTERONE MICRONIZED 100 MG PO CAPS
200.0000 mg | ORAL_CAPSULE | Freq: Every evening | ORAL | 1 refills | Status: DC
  Filled 2023-09-10: qty 180, 90d supply, fill #0
  Filled 2023-10-22: qty 60, 30d supply, fill #0

## 2023-09-10 MED ORDER — ESTRADIOL 0.05 MG/24HR TD PTTW
1.0000 | MEDICATED_PATCH | TRANSDERMAL | 1 refills | Status: DC
  Filled 2023-09-10: qty 24, 84d supply, fill #0

## 2023-09-10 NOTE — Progress Notes (Signed)
   Lisa Frederick 1971-02-05 [id number]   History:  53 y.o. G3P3 here for follow up after starting HRT 3 months ago. She is doing 75% better. Periods have returned.    She originally presented to discuss menopause symptoms and treatment options. LMP: 12/2022. Complains of trouble sleeping, joint pains, frozen shoulder, hot flashes, night sweats, anxiety.    Gynecologic History Patient's last menstrual period was 12/19/2022. Contraception/Family planning: vasectomy Sexually active: yes Last Pap: 2023. Results were: normal Last mammogram: 10/02/22. Results were: normal Obstetric History OB History  Gravida Para Term Preterm AB Living  3 3    3   SAB IAB Ectopic Multiple Live Births          # Outcome Date GA Lbr Len/2nd Weight Sex Type Anes PTL Lv  3 Para           2 Para           1 Para                The following portions of the patient's history were reviewed and updated as appropriate: allergies, current medications, past family history, past medical history, past social history, past surgical history, and problem list.  Review of Systems  All other systems reviewed and are negative.   Past medical history, past surgical history, family history and social history were all reviewed and documented in the EPIC chart.  Exam:  Vitals:   09/10/23 1415  BP: (!) 110/58  Pulse: 76  SpO2: 99%  Weight: 118 lb 3.2 oz (53.6 kg)   Body mass index is 20.13 kg/m.  Physical Exam Vitals and nursing note reviewed.  Constitutional:      Appearance: Normal appearance. She is normal weight.  Pulmonary:     Effort: Pulmonary effort is normal.  Neurological:     Mental Status: She is alert.  Psychiatric:        Mood and Affect: Mood normal.        Thought Content: Thought content normal.        Judgment: Judgment normal.      Ellis Guys, CMA present for exam  Assessment/Plan:   1. Menopausal symptoms (Primary) Schedule AEX Increase dose to control symptoms Reassured  increasing the progesterone  should help the bleeding - estradiol  (VIVELLE -DOT) 0.05 MG/24HR patch; Place 1 patch (0.05 mg total) onto the skin 2 (two) times a week.  Dispense: 24 patch; Refill: 1 - progesterone  (PROMETRIUM ) 100 MG capsule; Take 2 capsules (200 mg total) by mouth at bedtime.  Dispense: 180 capsule; Refill: 1   Savalas Monje B WHNP-BC 2:55 PM 09/10/2023

## 2023-09-11 ENCOUNTER — Other Ambulatory Visit (HOSPITAL_COMMUNITY): Payer: Self-pay

## 2023-09-26 ENCOUNTER — Encounter (HOSPITAL_COMMUNITY): Payer: Self-pay

## 2023-10-03 ENCOUNTER — Ambulatory Visit
Admission: RE | Admit: 2023-10-03 | Discharge: 2023-10-03 | Disposition: A | Discharge status: Home / Self Care | Source: Ambulatory Visit | Attending: Radiology | Admitting: Radiology

## 2023-10-03 DIAGNOSIS — R928 Other abnormal and inconclusive findings on diagnostic imaging of breast: Secondary | ICD-10-CM | POA: Diagnosis not present

## 2023-10-03 DIAGNOSIS — N6489 Other specified disorders of breast: Secondary | ICD-10-CM

## 2023-10-03 DIAGNOSIS — N6315 Unspecified lump in the right breast, overlapping quadrants: Secondary | ICD-10-CM | POA: Diagnosis not present

## 2023-10-06 ENCOUNTER — Ambulatory Visit: Payer: Self-pay | Admitting: Obstetrics and Gynecology

## 2023-10-22 ENCOUNTER — Other Ambulatory Visit (HOSPITAL_COMMUNITY): Payer: Self-pay

## 2023-10-28 ENCOUNTER — Ambulatory Visit: Admitting: Radiology

## 2023-11-11 ENCOUNTER — Other Ambulatory Visit (HOSPITAL_COMMUNITY): Payer: Self-pay

## 2023-11-11 ENCOUNTER — Ambulatory Visit (INDEPENDENT_AMBULATORY_CARE_PROVIDER_SITE_OTHER): Admitting: Radiology

## 2023-11-11 ENCOUNTER — Encounter: Payer: Self-pay | Admitting: Radiology

## 2023-11-11 VITALS — BP 104/62 | HR 88 | Ht 64.75 in | Wt 119.0 lb

## 2023-11-11 DIAGNOSIS — Z01419 Encounter for gynecological examination (general) (routine) without abnormal findings: Secondary | ICD-10-CM

## 2023-11-11 DIAGNOSIS — Z1331 Encounter for screening for depression: Secondary | ICD-10-CM | POA: Diagnosis not present

## 2023-11-11 DIAGNOSIS — N951 Menopausal and female climacteric states: Secondary | ICD-10-CM

## 2023-11-11 MED ORDER — ESTRADIOL 0.05 MG/24HR TD PTTW
1.0000 | MEDICATED_PATCH | TRANSDERMAL | 4 refills | Status: AC
  Filled 2023-11-11 – 2023-11-17 (×2): qty 24, 84d supply, fill #0
  Filled 2024-02-15: qty 24, 84d supply, fill #1
  Filled 2024-05-15 – 2024-05-17 (×2): qty 24, 84d supply, fill #2

## 2023-11-11 MED ORDER — PROGESTERONE MICRONIZED 100 MG PO CAPS
200.0000 mg | ORAL_CAPSULE | Freq: Every evening | ORAL | 4 refills | Status: AC
  Filled 2023-11-11 – 2023-11-17 (×2): qty 180, 90d supply, fill #0
  Filled 2024-02-15: qty 180, 90d supply, fill #1
  Filled 2024-05-15 – 2024-05-17 (×2): qty 180, 90d supply, fill #2

## 2023-11-11 NOTE — Progress Notes (Signed)
 Lisa Frederick [birth year] [id number]   History:  53 y.o. G3P3 presents for annual exam. Doing well on HRT. Has 2 days of light bleeding every 4 weeks. Perimenopausal symptoms are significantly better. Last natural menstrual cycle around 12 months ago.Currently alternating the 0.25mg  and 0.5mg  dosing weekly because of what she filled previously.  Gynecologic History Patient's last menstrual period was 11/09/2023. Period Duration (Days): 3 Period Pattern: (!) Irregular (spotting) Menstrual Flow: Light Menstrual Control: Thin pad, Tampon Dysmenorrhea: (!) Mild Dysmenorrhea Symptoms: Cramping Contraception/Family planning: vasectomy Sexually active: yes Last Pap: 2023. Results were: normal Last mammogram: 2025. Results were: normal  Obstetric History OB History  Gravida Para Term Preterm AB Living  3 3    3   SAB IAB Ectopic Multiple Live Births          # Outcome Date GA Lbr Len/2nd Weight Sex Type Anes PTL Lv  3 Para           2 Para           1 Para                11/11/2023    2:45 PM  Depression screen PHQ 2/9  Decreased Interest 0  Down, Depressed, Hopeless 0  PHQ - 2 Score 0     The following portions of the patient's history were reviewed and updated as appropriate: allergies, current medications, past family history, past medical history, past social history, past surgical history, and problem list.  Review of Systems  All other systems reviewed and are negative.   Past medical history, past surgical history, family history and social history were all reviewed and documented in the EPIC chart.  Exam:  Vitals:   11/11/23 1445  BP: 104/62  Pulse: 88  SpO2: 98%  Weight: 119 lb (54 kg)  Height: 5' 4.75 (1.645 m)   Body mass index is 19.96 kg/m.  Physical Exam Vitals and nursing note reviewed. Exam conducted with a chaperone present.  Constitutional:      Appearance: Normal appearance. She is normal weight.  HENT:     Head: Normocephalic and  atraumatic.  Neck:     Thyroid: No thyroid mass, thyromegaly or thyroid tenderness.   Cardiovascular:     Rate and Rhythm: Regular rhythm.     Heart sounds: Normal heart sounds.  Pulmonary:     Effort: Pulmonary effort is normal.     Breath sounds: Normal breath sounds.  Chest:  Breasts:    Breasts are symmetrical.     Right: Normal. No inverted nipple, mass, nipple discharge, skin change or tenderness.     Left: Normal. No inverted nipple, mass, nipple discharge, skin change or tenderness.  Abdominal:     General: Abdomen is flat. Bowel sounds are normal.     Palpations: Abdomen is soft.  Genitourinary:    General: Normal vulva.     Vagina: Normal. No vaginal discharge, bleeding or lesions.     Cervix: Normal. No discharge or lesion.     Uterus: Normal. Not enlarged and not tender.      Adnexa: Right adnexa normal and left adnexa normal.       Right: No mass, tenderness or fullness.         Left: No mass, tenderness or fullness.    Lymphadenopathy:     Upper Body:     Right upper body: No axillary adenopathy.     Left upper body: No axillary adenopathy.   Skin:  General: Skin is warm and dry.   Neurological:     Mental Status: She is alert and oriented to person, place, and time.   Psychiatric:        Mood and Affect: Mood normal.        Thought Content: Thought content normal.        Judgment: Judgment normal.      Darice Hoit, CMA present for exam  Assessment/Plan:   1. Well woman exam with routine gynecological exam (Primary) Pap 2026  2. Menopausal symptoms Will continue current doses, using consistently with the 0.05mg  dose of the patch instead of alternating. - estradiol  (VIVELLE -DOT) 0.05 MG/24HR patch; Place 1 patch (0.05 mg total) onto the skin 2 (two) times a week.  Dispense: 24 patch; Refill: 4 - progesterone  (PROMETRIUM ) 100 MG capsule; Take 2 capsules (200 mg total) by mouth at bedtime.  Dispense: 180 capsule; Refill: 4     Return in about 1  year (around 11/10/2024) for Annual.  GINETTE COZIER B WHNP-BC 3:04 PM 11/11/2023

## 2023-11-11 NOTE — Patient Instructions (Signed)
 Preventive Care 16-55 Years Old, Female  Preventive care refers to lifestyle choices and visits with your health care provider that can promote health and wellness. Preventive care visits are also called wellness exams.  What can I expect for my preventive care visit?  Counseling  Your health care provider may ask you questions about your:  Medical history, including:  Past medical problems.  Family medical history.  Pregnancy history.  Current health, including:  Menstrual cycle.  Method of birth control.  Emotional well-being.  Home life and relationship well-being.  Sexual activity and sexual health.  Lifestyle, including:  Alcohol, nicotine or tobacco, and drug use.  Access to firearms.  Diet, exercise, and sleep habits.  Work and work Astron[employer].  Sunscreen use.  Safety issues such as seatbelt and bike helmet use.  Physical exam  Your health care provider will check your:  Height and weight. These may be used to calculate your BMI (body mass index). BMI is a measurement that tells if you are at a healthy weight.  Waist circumference. This measures the distance around your waistline. This measurement also tells if you are at a healthy weight and may help predict your risk of certain diseases, such as type 2 diabetes and high blood pressure.  Heart rate and blood pressure.  Body temperature.  Skin for abnormal spots.  What immunizations do I need?    Vaccines are usually given at various ages, according to a schedule. Your health care provider will recommend vaccines for you based on your age, medical history, and lifestyle or other factors, such as travel or where you work.  What tests do I need?  Screening  Your health care provider may recommend screening tests for certain conditions. This may include:  Lipid and cholesterol levels.  Diabetes screening. This is done by checking your blood sugar (glucose) after you have not eaten for a while (fasting).  Pelvic exam and Pap test.  Hepatitis B test.  Hepatitis C  test.  HIV (human immunodeficiency virus) test.  STI (sexually transmitted infection) testing, if you are at risk.  Lung cancer screening.  Colorectal cancer screening.  Mammogram. Talk with your health care provider about when you should start having regular mammograms. This may depend on whether you have a family history of breast cancer.  BRCA-related cancer screening. This may be done if you have a family history of breast, ovarian, tubal, or peritoneal cancers.  Bone density scan. This is done to screen for osteoporosis.  Talk with your health care provider about your test results, treatment options, and if necessary, the need for more tests.  Follow these instructions at home:  Eating and drinking    Eat a diet that includes fresh fruits and vegetables, whole grains, lean protein, and low-fat dairy products.  Take vitamin and mineral supplements as recommended by your health care provider.  Do not drink alcohol if:  Your health care provider tells you not to drink.  You are pregnant, may be pregnant, or are planning to become pregnant.  If you drink alcohol:  Limit how much you have to 0-1 drink a day.  Know how much alcohol is in your drink. In the [place] one drink equals one 12 oz bottle of beer (355 mL), one 5 oz glass of wine (148 mL), or one 1 oz glass of hard liquor (44 mL).  Lifestyle  Brush your teeth every morning and night with fluoride toothpaste. Floss one time each day.  Exercise for at least  30 minutes 5 or more days each week.  Do not use any products that contain nicotine or tobacco. These products include cigarettes, chewing tobacco, and vaping devices, such as e-cigarettes. If you need help quitting, ask your health care provider.  Do not use drugs.  If you are sexually active, practice safe sex. Use a condom or other form of protection to prevent STIs.  If you do not wish to become pregnant, use a form of birth control. If you plan to become pregnant, see your health care provider for a  prepregnancy visit.  Take aspirin only as told by your health care provider. Make sure that you understand how much to take and what form to take. Work with your health care provider to find out whether it is safe and beneficial for you to take aspirin daily.  Find healthy ways to manage stress, such as:  Meditation, yoga, or listening to music.  Journaling.  Talking to a trusted person.  Spending time with friends and family.  Minimize exposure to UV radiation to reduce your risk of skin cancer.  Safety  Always wear your seat belt while driving or riding in a vehicle.  Do not drive:  If you have been drinking alcohol. Do not ride with someone who has been drinking.  When you are tired or distracted.  While texting.  If you have been using any mind-altering substances or drugs.  Wear a helmet and other protective equipment during sports activities.  If you have firearms in your house, make sure you follow all gun safety procedures.  Seek help if you have been physically or sexually abused.  What's next?  Visit your health care provider once a year for an annual wellness visit.  Ask your health care provider how often you should have your eyes and teeth checked.  Stay up to date on all vaccines.  This information is not intended to replace advice given to you by your health care provider. Make sure you discuss any questions you have with your health care provider.  Document Revised: 11/01/2020 Document Reviewed: 11/01/2020  Elsevier Patient Education  2024 [employer].

## 2023-11-17 ENCOUNTER — Other Ambulatory Visit: Payer: Self-pay

## 2023-11-17 ENCOUNTER — Other Ambulatory Visit (HOSPITAL_COMMUNITY): Payer: Self-pay

## 2023-11-17 ENCOUNTER — Other Ambulatory Visit (HOSPITAL_BASED_OUTPATIENT_CLINIC_OR_DEPARTMENT_OTHER): Payer: Self-pay

## 2023-11-18 ENCOUNTER — Other Ambulatory Visit (HOSPITAL_COMMUNITY): Payer: Self-pay

## 2024-02-17 ENCOUNTER — Other Ambulatory Visit (HOSPITAL_COMMUNITY): Payer: Self-pay

## 2024-05-17 ENCOUNTER — Other Ambulatory Visit (HOSPITAL_COMMUNITY): Payer: Self-pay
